# Patient Record
Sex: Male | Born: 1955 | Race: Black or African American | Hispanic: No | Marital: Married | Smoking: Never smoker
Health system: Southern US, Community
[De-identification: ages and names within clinical notes are randomized; demographics above are authoritative.]

## PROBLEM LIST (undated history)

## (undated) DIAGNOSIS — E119 Type 2 diabetes mellitus without complications: Secondary | ICD-10-CM

## (undated) DIAGNOSIS — I10 Essential (primary) hypertension: Secondary | ICD-10-CM

## (undated) DIAGNOSIS — E785 Hyperlipidemia, unspecified: Secondary | ICD-10-CM

## (undated) HISTORY — DX: Hyperlipidemia, unspecified: E78.5

## (undated) HISTORY — DX: Essential (primary) hypertension: I10

## (undated) HISTORY — PX: HEMORROIDECTOMY: SUR656

## (undated) HISTORY — DX: Type 2 diabetes mellitus without complications: E11.9

---

## 2013-02-20 ENCOUNTER — Other Ambulatory Visit: Payer: Self-pay | Admitting: Neurology

## 2013-02-20 DIAGNOSIS — R51 Headache: Principal | ICD-10-CM

## 2013-02-20 DIAGNOSIS — R519 Headache, unspecified: Secondary | ICD-10-CM

## 2013-03-01 ENCOUNTER — Ambulatory Visit (HOSPITAL_COMMUNITY)
Admission: RE | Admit: 2013-03-01 | Discharge: 2013-03-01 | Disposition: A | Discharge status: Home / Self Care | Payer: Managed Care, Other (non HMO) | Source: Ambulatory Visit | Attending: Neurology | Admitting: Neurology

## 2013-03-01 DIAGNOSIS — R51 Headache: Secondary | ICD-10-CM | POA: Insufficient documentation

## 2013-03-01 DIAGNOSIS — J3489 Other specified disorders of nose and nasal sinuses: Secondary | ICD-10-CM | POA: Insufficient documentation

## 2013-03-01 DIAGNOSIS — R519 Headache, unspecified: Secondary | ICD-10-CM

## 2013-03-01 DIAGNOSIS — I10 Essential (primary) hypertension: Secondary | ICD-10-CM | POA: Insufficient documentation

## 2015-03-12 LAB — HEMOGLOBIN A1C: HEMOGLOBIN A1C: 8.4

## 2015-06-01 ENCOUNTER — Ambulatory Visit (INDEPENDENT_AMBULATORY_CARE_PROVIDER_SITE_OTHER): Payer: Self-pay | Admitting: "Endocrinology

## 2015-06-01 ENCOUNTER — Encounter: Payer: Self-pay | Admitting: "Endocrinology

## 2015-06-01 VITALS — BP 135/84 | HR 72 | Ht 70.0 in | Wt 200.0 lb

## 2015-06-01 DIAGNOSIS — E782 Mixed hyperlipidemia: Secondary | ICD-10-CM | POA: Insufficient documentation

## 2015-06-01 DIAGNOSIS — E785 Hyperlipidemia, unspecified: Secondary | ICD-10-CM

## 2015-06-01 DIAGNOSIS — I10 Essential (primary) hypertension: Secondary | ICD-10-CM | POA: Insufficient documentation

## 2015-06-01 DIAGNOSIS — E118 Type 2 diabetes mellitus with unspecified complications: Secondary | ICD-10-CM

## 2015-06-01 DIAGNOSIS — E119 Type 2 diabetes mellitus without complications: Secondary | ICD-10-CM | POA: Insufficient documentation

## 2015-06-01 DIAGNOSIS — E1165 Type 2 diabetes mellitus with hyperglycemia: Secondary | ICD-10-CM

## 2015-06-01 DIAGNOSIS — IMO0002 Reserved for concepts with insufficient information to code with codable children: Secondary | ICD-10-CM

## 2015-06-01 MED ORDER — EMPAGLIFLOZIN 10 MG PO TABS: 10.0000 mg | ORAL_TABLET | Freq: Every day | ORAL | Status: DC

## 2015-06-01 NOTE — Patient Instructions (Signed)

## 2015-06-01 NOTE — Progress Notes (Signed)
Subjective:    Patient ID: Darryl Mayer, male    DOB: 1955/02/16. Patient is being seen in consultation for management of diabetes requested by  TAPPER,DAVID B, MD  Past Medical History  Diagnosis Date  . Diabetes mellitus, type II ([place])   . Hypertension   . Hyperlipidemia    Past Surgical History  Procedure Laterality Date  . Hemorroidectomy     Social History   Social History  . Marital Status: Married    Spouse Name: N/A  . Number of Children: N/A  . Years of Education: N/A   Social History Main Topics  . Smoking status: Never Smoker   . Smokeless tobacco: None  . Alcohol Use: None  . Drug Use: No  . Sexual Activity: Not Asked   Other Topics Concern  . None   Social History Narrative  . None   Outpatient Encounter Prescriptions as of 06/01/2015  Medication Sig  . aspirin 81 MG tablet Take 81 mg by mouth daily.  Marland Kitchen atorvastatin (LIPITOR) 10 MG tablet Take 10 mg by mouth daily.  Marland Kitchen lisinopril-hydrochlorothiazide (PRINZIDE,ZESTORETIC) 20-12.5 MG tablet Take 2 tablets by mouth daily.  . metFORMIN (GLUCOPHAGE) 1000 MG tablet Take 1,000 mg by mouth 2 (two) times daily with a meal.  . NIFEdipine (PROCARDIA XL/ADALAT-CC) 90 MG 24 hr tablet Take 90 mg by mouth daily.  . potassium chloride (K-DUR) 10 MEQ tablet Take 10 mEq by mouth daily.  . tamsulosin (FLOMAX) 0.4 MG CAPS capsule Take 0.8 mg by mouth at bedtime.  . [DISCONTINUED] glimepiride (AMARYL) 4 MG tablet Take 4 mg by mouth daily with breakfast.  . empagliflozin (JARDIANCE) 10 MG TABS tablet Take 10 mg by mouth daily.   No facility-administered encounter medications on file as of 06/01/2015.   ALLERGIES: No Known Allergies VACCINATION STATUS:  There is no immunization history on file for this patient.  Diabetes He presents for his initial diabetic visit. He has type 2 diabetes mellitus. Onset time: He was diagnosed at approximate age of 5 years. His disease course has been worsening. There are no  hypoglycemic associated symptoms. Pertinent negatives for hypoglycemia include no confusion, headaches, pallor or seizures. Associated symptoms include polydipsia and polyuria. Pertinent negatives for diabetes include no chest pain, no fatigue, no polyphagia and no weakness. There are no hypoglycemic complications. Symptoms are worsening. There are no diabetic complications. Risk factors for coronary artery disease include diabetes mellitus, dyslipidemia, male sex, hypertension and sedentary lifestyle. Current diabetic treatment includes oral agent (dual therapy). His weight is stable. He is following a generally unhealthy diet. When asked about meal planning, he reported none. He has not had a previous visit with a dietitian. He never participates in exercise. Home blood sugar record trend: He came with a log showing fasting blood glucose profile for the last week ranging between 101 -160. His breakfast blood glucose range is generally 130-140 mg/dl. An ACE inhibitor/angiotensin II receptor blocker is being taken. Eye exam is current.  Hyperlipidemia This is a chronic problem. The current episode started more than 1 year ago. Pertinent negatives include no chest pain, myalgias or shortness of breath. Current antihyperlipidemic treatment includes statins. Risk factors for coronary artery disease include diabetes mellitus, dyslipidemia, hypertension, male sex and a sedentary lifestyle.  Hypertension This is a chronic problem. The current episode started more than 1 year ago. Pertinent negatives include no chest pain, headaches, neck pain, palpitations or shortness of breath. Risk factors for coronary artery disease include dyslipidemia, diabetes mellitus, male  gender and sedentary lifestyle. Past treatments include ACE inhibitors.   Review of Systems  Constitutional: Negative for fever, chills, fatigue and unexpected weight change.  HENT: Negative for dental problem, mouth sores and trouble swallowing.    Eyes: Negative for visual disturbance.  Respiratory: Negative for cough, choking, chest tightness, shortness of breath and wheezing.   Cardiovascular: Negative for chest pain, palpitations and leg swelling.  Gastrointestinal: Negative for nausea, vomiting, abdominal pain, diarrhea, constipation and abdominal distention.  Endocrine: Positive for polydipsia and polyuria. Negative for polyphagia.  Genitourinary: Negative for dysuria, urgency, hematuria and flank pain.  Musculoskeletal: Negative for myalgias, back pain, gait problem and neck pain.  Skin: Negative for pallor, rash and wound.  Neurological: Negative for seizures, syncope, weakness, numbness and headaches.  Psychiatric/Behavioral: Negative.  Negative for confusion and dysphoric mood.    Objective:    BP 135/84 mmHg  Pulse 72  Ht 5\' 10"  (1.778 m)  Wt 200 lb (90.719 kg)  BMI 28.70 kg/m2  SpO2 97%  Wt Readings from Last 3 Encounters:  06/01/15 200 lb (90.719 kg)    Physical Exam  Constitutional: He is oriented to person, place, and time. He appears well-developed and well-nourished. He is cooperative. No distress.  HENT:  Head: Normocephalic and atraumatic.  Eyes: EOM are normal.  Neck: Normal range of motion. Neck supple. No tracheal deviation present. No thyromegaly present.  Cardiovascular: Normal rate, S1 normal, S2 normal and normal heart sounds.  Exam reveals no gallop.   No murmur heard. Pulses:      Dorsalis pedis pulses are 1+ on the right side, and 1+ on the left side.       Posterior tibial pulses are 1+ on the right side, and 1+ on the left side.  Pulmonary/Chest: Breath sounds normal. No respiratory distress. He has no wheezes.  Abdominal: Soft. Bowel sounds are normal. He exhibits no distension. There is no tenderness. There is no guarding and no CVA tenderness.  Musculoskeletal: He exhibits no edema.       Right shoulder: He exhibits no swelling and no deformity.  Neurological: He is alert and oriented to  person, place, and time. He has normal strength and normal reflexes. No cranial nerve deficit or sensory deficit. Gait normal.  Skin: Skin is warm and dry. No rash noted. No cyanosis. Nails show no clubbing.  Psychiatric: He has a normal mood and affect. His speech is normal and behavior is normal. Judgment and thought content normal. Cognition and memory are normal.        Assessment & Plan:   1. Uncontrolled type 2 diabetes mellitus with complication, without long-term current use of insulin ([place])  - Patient has currently uncontrolled symptomatic type 2 DM since  60 years of age,  with most recent A1c of 8.4 %. Recent labs reviewed.  - No reported gross complications, however patient remains at a high risk for more acute and chronic complications of diabetes which include CAD, CVA, CKD, retinopathy, and neuropathy. These are all discussed in detail with the patient.  - I have counseled the patient on diet management and weight loss, by adopting a carbohydrate restricted/protein rich diet.  - Suggestion is made for patient to avoid simple carbohydrates   from their diet including Cakes , Desserts, Ice Cream,  Soda (  diet and regular) , Sweet Tea , Candies,  Chips, Cookies, Artificial Sweeteners,   and "Sugar-free" Products . This will help patient to have stable blood glucose profile and potentially avoid  unintended weight gain.  - I encouraged the patient to switch to  unprocessed or minimally processed complex starch and increased protein intake (animal or plant source), fruits, and vegetables.  - Patient is advised to stick to a routine mealtimes to eat 3 meals  a day and avoid unnecessary snacks ( to snack only to correct hypoglycemia).  - The patient will be scheduled with Jearld Fenton, RDN, CDE for individualized DM education.  - I have approached patient with the following individualized plan to manage diabetes and patient agrees:   - I  will proceed to initiate strict  monitoring of glucose  AC and HS. - If his glycemic profile is significantly above target he will be approached for a brief period off basal insulin therapy.  -Patient is encouraged to call clinic for blood glucose levels less than 70 or above 300 mg /dl. - I will continue metformin 1000 mg by mouth twice a day, therapeutically suitable for patient. - I will discontinue glimepiride, risk outweighs benefit for this patient. -I will add Jardiance 10 mg by mouth every a.m. Side effects and precautions discussed with him.  - Patient will be considered for incretin therapy as appropriate next visit. - Patient specific target  A1c;  LDL, HDL, Triglycerides, and  Waist Circumference were discussed in detail.  2) BP/HTN: Controlled. Continue current medications including ACEI/ARB. 3) Lipids/HPL:  Controlled, LDL 75.  continue statins. 4)  Weight/Diet: CDE Consult will be initiated , exercise, and detailed carbohydrates information provided.  5) Chronic Care/Health Maintenance:  -Patient is on ACEI/ARB and Statin medications and encouraged to continue to follow up with Ophthalmology, Podiatrist at least yearly or according to recommendations, and advised to   stay away from smoking. I have recommended yearly flu vaccine and pneumonia vaccination at least every 5 years; moderate intensity exercise for up to 150 minutes weekly; and  sleep for at least 7 hours a day.  - 60 minutes of time was spent on the care of this patient , 50% of which was applied for counseling on diabetes complications and their preventions.  - Patient to bring meter and  blood glucose logs during their next visit.   - I advised patient to maintain close follow up with TAPPER,DAVID B, MD for primary care needs.  Follow up plan: - Return in about 1 week (around 06/08/2015) for diabetes, high blood pressure, high cholesterol, follow up with meter and logs- no labs.  Glade Lloyd, MD Phone: [phone]  Fax: [phone]    06/01/2015, 4:35 PM

## 2015-06-02 ENCOUNTER — Other Ambulatory Visit: Payer: Self-pay

## 2015-06-02 MED ORDER — GLUCOSE BLOOD VI STRP: ORAL_STRIP | Status: DC

## 2015-06-18 ENCOUNTER — Encounter: Payer: Self-pay | Admitting: "Endocrinology

## 2015-06-22 ENCOUNTER — Encounter: Payer: Self-pay | Admitting: "Endocrinology

## 2015-06-22 ENCOUNTER — Ambulatory Visit (INDEPENDENT_AMBULATORY_CARE_PROVIDER_SITE_OTHER): Payer: Managed Care, Other (non HMO) | Admitting: "Endocrinology

## 2015-06-22 VITALS — BP 110/69 | HR 64 | Ht 70.0 in | Wt 195.0 lb

## 2015-06-22 DIAGNOSIS — IMO0002 Reserved for concepts with insufficient information to code with codable children: Secondary | ICD-10-CM

## 2015-06-22 DIAGNOSIS — E118 Type 2 diabetes mellitus with unspecified complications: Secondary | ICD-10-CM

## 2015-06-22 DIAGNOSIS — E785 Hyperlipidemia, unspecified: Secondary | ICD-10-CM | POA: Diagnosis not present

## 2015-06-22 DIAGNOSIS — E1165 Type 2 diabetes mellitus with hyperglycemia: Secondary | ICD-10-CM | POA: Diagnosis not present

## 2015-06-22 DIAGNOSIS — I10 Essential (primary) hypertension: Secondary | ICD-10-CM | POA: Diagnosis not present

## 2015-06-22 NOTE — Progress Notes (Signed)
Subjective:    Patient ID: Darryl Mayer, male    DOB: [birth year]. Patient is being seen in Follow-up for management of diabetes requested by  TAPPER,DAVID B, MD  Past Medical History  Diagnosis Date  . Diabetes mellitus, type II ([place])   . Hypertension   . Hyperlipidemia    Past Surgical History  Procedure Laterality Date  . Hemorroidectomy     Social History   Social History  . Marital Status: Married    Spouse Name: N/A  . Number of Children: N/A  . Years of Education: N/A   Social History Main Topics  . Smoking status: Never Smoker   . Smokeless tobacco: None  . Alcohol Use: None  . Drug Use: No  . Sexual Activity: Not Asked   Other Topics Concern  . None   Social History Narrative   Outpatient Encounter Prescriptions as of 06/22/2015  Medication Sig  . aspirin 81 MG tablet Take 81 mg by mouth daily.  Marland Kitchen atorvastatin (LIPITOR) 10 MG tablet Take 10 mg by mouth daily.  . empagliflozin (JARDIANCE) 10 MG TABS tablet Take 10 mg by mouth daily.  Marland Kitchen glucose blood (ONETOUCH VERIO) test strip Use as instructed 4 x daily. E11.65  . lisinopril-hydrochlorothiazide (PRINZIDE,ZESTORETIC) 20-12.5 MG tablet Take 2 tablets by mouth daily.  . metFORMIN (GLUCOPHAGE) 1000 MG tablet Take 1,000 mg by mouth 2 (two) times daily with a meal.  . NIFEdipine (PROCARDIA XL/ADALAT-CC) 90 MG 24 hr tablet Take 90 mg by mouth daily.  . potassium chloride (K-DUR) 10 MEQ tablet Take 10 mEq by mouth daily.  . tamsulosin (FLOMAX) 0.4 MG CAPS capsule Take 0.8 mg by mouth at bedtime.   No facility-administered encounter medications on file as of 06/22/2015.   ALLERGIES: No Known Allergies VACCINATION STATUS:  There is no immunization history on file for this patient.  Diabetes He presents for his follow-up diabetic visit. He has type 2 diabetes mellitus. Onset time: He was diagnosed at approximate age of 3 years. His disease course has been improving. There are no hypoglycemic associated  symptoms. Pertinent negatives for hypoglycemia include no confusion, headaches, pallor or seizures. Associated symptoms include polydipsia and polyuria. Pertinent negatives for diabetes include no chest pain, no fatigue, no polyphagia and no weakness. There are no hypoglycemic complications. Symptoms are improving. There are no diabetic complications. Risk factors for coronary artery disease include diabetes mellitus, dyslipidemia, male sex, hypertension and sedentary lifestyle. Current diabetic treatment includes oral agent (dual therapy). His weight is decreasing steadily. He is following a generally unhealthy diet. When asked about meal planning, he reported none. He has not had a previous visit with a dietitian. He never participates in exercise. His breakfast blood glucose range is generally 110-130 mg/dl. His lunch blood glucose range is generally 130-140 mg/dl. His dinner blood glucose range is generally 130-140 mg/dl. His overall blood glucose range is 130-140 mg/dl. An ACE inhibitor/angiotensin II receptor blocker is being taken. Eye exam is current.  Hyperlipidemia This is a chronic problem. The current episode started more than 1 year ago. Pertinent negatives include no chest pain, myalgias or shortness of breath. Current antihyperlipidemic treatment includes statins. Risk factors for coronary artery disease include diabetes mellitus, dyslipidemia, hypertension, male sex and a sedentary lifestyle.  Hypertension This is a chronic problem. The current episode started more than 1 year ago. Pertinent negatives include no chest pain, headaches, neck pain, palpitations or shortness of breath. Risk factors for coronary artery disease include dyslipidemia, diabetes mellitus, male  gender and sedentary lifestyle. Past treatments include ACE inhibitors.   Review of Systems  Constitutional: Negative for fever, chills, fatigue and unexpected weight change.  HENT: Negative for dental problem, mouth sores and  trouble swallowing.   Eyes: Negative for visual disturbance.  Respiratory: Negative for cough, choking, chest tightness, shortness of breath and wheezing.   Cardiovascular: Negative for chest pain, palpitations and leg swelling.  Gastrointestinal: Negative for nausea, vomiting, abdominal pain, diarrhea, constipation and abdominal distention.  Endocrine: Positive for polydipsia and polyuria. Negative for polyphagia.  Genitourinary: Negative for dysuria, urgency, hematuria and flank pain.  Musculoskeletal: Negative for myalgias, back pain, gait problem and neck pain.  Skin: Negative for pallor, rash and wound.  Neurological: Negative for seizures, syncope, weakness, numbness and headaches.  Psychiatric/Behavioral: Negative.  Negative for confusion and dysphoric mood.    Objective:    BP 110/69 mmHg  Pulse 64  Ht 5\' 10"  (1.778 m)  Wt 195 lb (88.451 kg)  BMI 27.98 kg/m2  SpO2 96%  Wt Readings from Last 3 Encounters:  06/22/15 195 lb (88.451 kg)  06/01/15 200 lb (90.719 kg)    Physical Exam  Constitutional: He is oriented to person, place, and time. He appears well-developed and well-nourished. He is cooperative. No distress.  HENT:  Head: Normocephalic and atraumatic.  Eyes: EOM are normal.  Neck: Normal range of motion. Neck supple. No tracheal deviation present. No thyromegaly present.  Cardiovascular: Normal rate, S1 normal, S2 normal and normal heart sounds.  Exam reveals no gallop.   No murmur heard. Pulses:      Dorsalis pedis pulses are 1+ on the right side, and 1+ on the left side.       Posterior tibial pulses are 1+ on the right side, and 1+ on the left side.  Pulmonary/Chest: Breath sounds normal. No respiratory distress. He has no wheezes.  Abdominal: Soft. Bowel sounds are normal. He exhibits no distension. There is no tenderness. There is no guarding and no CVA tenderness.  Musculoskeletal: He exhibits no edema.       Right shoulder: He exhibits no swelling and no  deformity.  Neurological: He is alert and oriented to person, place, and time. He has normal strength and normal reflexes. No cranial nerve deficit or sensory deficit. Gait normal.  Skin: Skin is warm and dry. No rash noted. No cyanosis. Nails show no clubbing.  Psychiatric: He has a normal mood and affect. His speech is normal and behavior is normal. Judgment and thought content normal. Cognition and memory are normal.     Assessment & Plan:   1. Uncontrolled type 2 diabetes mellitus with complication, without long-term current use of insulin ([place])  - Patient has currently uncontrolled symptomatic type 2 DM since  60 years of age,  with most recent A1c of 8.4 %. Recent labs reviewed.  - No reported gross complications, however patient remains at a high risk for more acute and chronic complications of diabetes which include CAD, CVA, CKD, retinopathy, and neuropathy. These are all discussed in detail with the patient.  - I have counseled the patient on diet management and weight loss, by adopting a carbohydrate restricted/protein rich diet.  - Suggestion is made for patient to avoid simple carbohydrates   from their diet including Cakes , Desserts, Ice Cream,  Soda (  diet and regular) , Sweet Tea , Candies,  Chips, Cookies, Artificial Sweeteners,   and "Sugar-free" Products . This will help patient to have stable blood glucose profile  and potentially avoid unintended weight gain.  - I encouraged the patient to switch to  unprocessed or minimally processed complex starch and increased protein intake (animal or plant source), fruits, and vegetables.  - Patient is advised to stick to a routine mealtimes to eat 3 meals  a day and avoid unnecessary snacks ( to snack only to correct hypoglycemia).  - The patient will be scheduled with Jearld Fenton, RDN, CDE for individualized DM education.  - I have approached patient with the following individualized plan to manage diabetes and patient agrees:    - I will continue metformin 1000 mg by mouth twice a day, therapeutically suitable for patient.  -I will continue Jardiance 10 mg by mouth every a.m. Side effects and precautions discussed with him. - He will not need insulin therapy for now. - Patient will be considered for incretin therapy as appropriate next visit. - Patient specific target  A1c;  LDL, HDL, Triglycerides, and  Waist Circumference were discussed in detail.  2) BP/HTN: Controlled. Continue current medications including ACEI/ARB. 3) Lipids/HPL:  Controlled, LDL 75.  continue statins. 4)  Weight/Diet: CDE Consult will be initiated , exercise, and detailed carbohydrates information provided.  5) Chronic Care/Health Maintenance:  -Patient is on ACEI/ARB and Statin medications and encouraged to continue to follow up with Ophthalmology, Podiatrist at least yearly or according to recommendations, and advised to   stay away from smoking. I have recommended yearly flu vaccine and pneumonia vaccination at least every 5 years; moderate intensity exercise for up to 150 minutes weekly; and  sleep for at least 7 hours a day.  - 60 minutes of time was spent on the care of this patient , 50% of which was applied for counseling on diabetes complications and their preventions.  - Patient to bring meter and  blood glucose logs during their next visit.   - I advised patient to maintain close follow up with TAPPER,DAVID B, MD for primary care needs.  Follow up plan: - Return in about 8 weeks (around 08/17/2015) for follow up with pre-visit labs.  Glade Lloyd, MD Phone: [phone]  Fax: [phone]   06/22/2015, 5:02 PM

## 2015-06-22 NOTE — Patient Instructions (Signed)

## 2015-07-09 ENCOUNTER — Other Ambulatory Visit: Payer: Self-pay

## 2015-07-09 MED ORDER — EMPAGLIFLOZIN 10 MG PO TABS: 10.0000 mg | ORAL_TABLET | Freq: Every day | ORAL | Status: DC

## 2015-07-13 ENCOUNTER — Other Ambulatory Visit: Payer: Self-pay

## 2015-07-13 MED ORDER — EMPAGLIFLOZIN 10 MG PO TABS: 10.0000 mg | ORAL_TABLET | Freq: Every day | ORAL | Status: DC

## 2015-08-07 LAB — LIPID PANEL
Cholesterol: 128 mg/dL (ref 0–200)
HDL: 47 mg/dL (ref 35–70)
LDL Cholesterol: 67 mg/dL
TRIGLYCERIDES: 68 mg/dL (ref 40–160)

## 2015-08-07 LAB — HEMOGLOBIN A1C: HEMOGLOBIN A1C: 7.4

## 2015-08-07 LAB — TSH: TSH: 2.3 u[IU]/mL (ref <=5.90)

## 2015-08-17 ENCOUNTER — Ambulatory Visit (INDEPENDENT_AMBULATORY_CARE_PROVIDER_SITE_OTHER): Payer: Managed Care, Other (non HMO) | Admitting: "Endocrinology

## 2015-08-17 ENCOUNTER — Encounter: Payer: Self-pay | Admitting: "Endocrinology

## 2015-08-17 VITALS — BP 121/75 | HR 73 | Ht 70.0 in | Wt 190.0 lb

## 2015-08-17 DIAGNOSIS — E118 Type 2 diabetes mellitus with unspecified complications: Secondary | ICD-10-CM

## 2015-08-17 DIAGNOSIS — E1165 Type 2 diabetes mellitus with hyperglycemia: Secondary | ICD-10-CM | POA: Diagnosis not present

## 2015-08-17 DIAGNOSIS — I10 Essential (primary) hypertension: Secondary | ICD-10-CM | POA: Diagnosis not present

## 2015-08-17 DIAGNOSIS — IMO0002 Reserved for concepts with insufficient information to code with codable children: Secondary | ICD-10-CM

## 2015-08-17 DIAGNOSIS — E785 Hyperlipidemia, unspecified: Secondary | ICD-10-CM

## 2015-08-17 NOTE — Progress Notes (Signed)
Subjective:    Patient ID: Darryl Mayer, male    DOB: [birth year]. Patient is being seen in Follow-up for management of diabetes requested by  TAPPER,DAVID B, MD  Past Medical History  Diagnosis Date  . Diabetes mellitus, type II ([place])   . Hypertension   . Hyperlipidemia    Past Surgical History  Procedure Laterality Date  . Hemorroidectomy     Social History   Social History  . Marital Status: Married    Spouse Name: N/A  . Number of Children: N/A  . Years of Education: N/A   Social History Main Topics  . Smoking status: Never Smoker   . Smokeless tobacco: None  . Alcohol Use: None  . Drug Use: No  . Sexual Activity: Not Asked   Other Topics Concern  . None   Social History Narrative   Outpatient Encounter Prescriptions as of 08/17/2015  Medication Sig  . aspirin 81 MG tablet Take 81 mg by mouth daily.  Marland Kitchen atorvastatin (LIPITOR) 10 MG tablet Take 10 mg by mouth daily.  . empagliflozin (JARDIANCE) 10 MG TABS tablet Take 10 mg by mouth daily.  Marland Kitchen glucose blood (ONETOUCH VERIO) test strip Use as instructed 4 x daily. E11.65  . lisinopril-hydrochlorothiazide (PRINZIDE,ZESTORETIC) 20-12.5 MG tablet Take 2 tablets by mouth daily.  . metFORMIN (GLUCOPHAGE) 1000 MG tablet Take 1,000 mg by mouth 2 (two) times daily with a meal.  . NIFEdipine (PROCARDIA XL/ADALAT-CC) 90 MG 24 hr tablet Take 90 mg by mouth daily.  . potassium chloride (K-DUR) 10 MEQ tablet Take 10 mEq by mouth daily.  . tamsulosin (FLOMAX) 0.4 MG CAPS capsule Take 0.8 mg by mouth at bedtime.   No facility-administered encounter medications on file as of 08/17/2015.   ALLERGIES: No Known Allergies VACCINATION STATUS:  There is no immunization history on file for this patient.  Diabetes He presents for his follow-up diabetic visit. He has type 2 diabetes mellitus. Onset time: He was diagnosed at approximate age of 60 years. His disease course has been improving. There are no hypoglycemic associated  symptoms. Pertinent negatives for hypoglycemia include no confusion, headaches, pallor or seizures. Associated symptoms include polydipsia and polyuria. Pertinent negatives for diabetes include no chest pain, no fatigue, no polyphagia and no weakness. There are no hypoglycemic complications. Symptoms are improving. There are no diabetic complications. Risk factors for coronary artery disease include diabetes mellitus, dyslipidemia, male sex, hypertension and sedentary lifestyle. Current diabetic treatment includes oral agent (dual therapy). His weight is decreasing steadily. He is following a generally unhealthy diet. When asked about meal planning, he reported none. He has not had a previous visit with a dietitian. He never participates in exercise. An ACE inhibitor/angiotensin II receptor blocker is being taken. Eye exam is current.  Hyperlipidemia This is a chronic problem. The current episode started more than 1 year ago. Pertinent negatives include no chest pain, myalgias or shortness of breath. Current antihyperlipidemic treatment includes statins. Risk factors for coronary artery disease include diabetes mellitus, dyslipidemia, hypertension, male sex and a sedentary lifestyle.  Hypertension This is a chronic problem. The current episode started more than 1 year ago. Pertinent negatives include no chest pain, headaches, neck pain, palpitations or shortness of breath. Risk factors for coronary artery disease include dyslipidemia, diabetes mellitus, male gender and sedentary lifestyle. Past treatments include ACE inhibitors.   Review of Systems  Constitutional: Negative for fever, chills, fatigue and unexpected weight change.  HENT: Negative for dental problem, mouth sores and trouble  swallowing.   Eyes: Negative for visual disturbance.  Respiratory: Negative for cough, choking, chest tightness, shortness of breath and wheezing.   Cardiovascular: Negative for chest pain, palpitations and leg swelling.   Gastrointestinal: Negative for nausea, vomiting, abdominal pain, diarrhea, constipation and abdominal distention.  Endocrine: Positive for polydipsia and polyuria. Negative for polyphagia.  Genitourinary: Negative for dysuria, urgency, hematuria and flank pain.  Musculoskeletal: Negative for myalgias, back pain, gait problem and neck pain.  Skin: Negative for pallor, rash and wound.  Neurological: Negative for seizures, syncope, weakness, numbness and headaches.  Psychiatric/Behavioral: Negative.  Negative for confusion and dysphoric mood.    Objective:    BP 121/75 mmHg  Pulse 73  Ht 5\' 10"  (1.778 m)  Wt 190 lb (86.183 kg)  BMI 27.26 kg/m2  Wt Readings from Last 3 Encounters:  08/17/15 190 lb (86.183 kg)  06/22/15 195 lb (88.451 kg)  06/01/15 200 lb (90.719 kg)    Physical Exam  Constitutional: He is oriented to person, place, and time. He appears well-developed and well-nourished. He is cooperative. No distress.  HENT:  Head: Normocephalic and atraumatic.  Eyes: EOM are normal.  Neck: Normal range of motion. Neck supple. No tracheal deviation present. No thyromegaly present.  Cardiovascular: Normal rate, S1 normal, S2 normal and normal heart sounds.  Exam reveals no gallop.   No murmur heard. Pulses:      Dorsalis pedis pulses are 1+ on the right side, and 1+ on the left side.       Posterior tibial pulses are 1+ on the right side, and 1+ on the left side.  Pulmonary/Chest: Breath sounds normal. No respiratory distress. He has no wheezes.  Abdominal: Soft. Bowel sounds are normal. He exhibits no distension. There is no tenderness. There is no guarding and no CVA tenderness.  Musculoskeletal: He exhibits no edema.       Right shoulder: He exhibits no swelling and no deformity.  Neurological: He is alert and oriented to person, place, and time. He has normal strength and normal reflexes. No cranial nerve deficit or sensory deficit. Gait normal.  Skin: Skin is warm and dry. No  rash noted. No cyanosis. Nails show no clubbing.  Psychiatric: He has a normal mood and affect. His speech is normal and behavior is normal. Judgment and thought content normal. Cognition and memory are normal.     Assessment & Plan:   1. Uncontrolled type 2 diabetes mellitus with complication, without long-term current use of insulin ([place])  - Patient has currently uncontrolled symptomatic type 2 DM since  60 years of age,  with most recent A1c of 7.4% improving from 8.4 %. Recent labs reviewed.  - No reported gross complications, however patient remains at a high risk for more acute and chronic complications of diabetes which include CAD, CVA, CKD, retinopathy, and neuropathy. These are all discussed in detail with the patient.  - I have counseled the patient on diet management and weight loss, by adopting a carbohydrate restricted/protein rich diet.  - Suggestion is made for patient to avoid simple carbohydrates   from their diet including Cakes , Desserts, Ice Cream,  Soda (  diet and regular) , Sweet Tea , Candies,  Chips, Cookies, Artificial Sweeteners,   and "Sugar-free" Products . This will help patient to have stable blood glucose profile and potentially avoid unintended weight gain.  - I encouraged the patient to switch to  unprocessed or minimally processed complex starch and increased protein intake (animal or plant  source), fruits, and vegetables.  - Patient is advised to stick to a routine mealtimes to eat 3 meals  a day and avoid unnecessary snacks ( to snack only to correct hypoglycemia).  - The patient will be scheduled with Jearld Fenton, RDN, CDE for individualized DM education.  - I have approached patient with the following individualized plan to manage diabetes and patient agrees:   - Based on his progress, he will not need insulin treatment for now. I will continue metformin 1000 mg by mouth twice a day, therapeutically suitable for patient.  -I will continue  Jardiance 10 mg by mouth every a.m. Side effects and precautions discussed with him.  - Patient will be considered for incretin therapy as appropriate next visit. - Patient specific target  A1c;  LDL, HDL, Triglycerides, and  Waist Circumference were discussed in detail.  2) BP/HTN: Controlled. Continue current medications including ACEI/ARB. 3) Lipids/HPL:  Controlled, LDL 75.  continue statins. 4)  Weight/Diet: CDE Consult will be initiated , exercise, and detailed carbohydrates information provided.  5) Chronic Care/Health Maintenance:  -Patient is on ACEI/ARB and Statin medications and encouraged to continue to follow up with Ophthalmology, Podiatrist at least yearly or according to recommendations, and advised to   stay away from smoking. I have recommended yearly flu vaccine and pneumonia vaccination at least every 5 years; moderate intensity exercise for up to 150 minutes weekly; and  sleep for at least 7 hours a day.  - 25 minutes of time was spent on the care of this patient , 50% of which was applied for counseling on diabetes complications and their preventions.  - Patient to bring meter and  blood glucose logs during their next visit.   - I advised patient to maintain close follow up with TAPPER,DAVID B, MD for primary care needs.  Follow up plan: - Return in about 3 months (around 11/17/2015) for follow up with pre-visit labs.  Glade Lloyd, MD Phone: [phone]  Fax: [phone]   08/17/2015, 3:59 PM

## 2015-08-17 NOTE — Patient Instructions (Signed)

## 2015-09-19 ENCOUNTER — Other Ambulatory Visit: Payer: Self-pay | Admitting: "Endocrinology

## 2015-11-11 ENCOUNTER — Other Ambulatory Visit: Payer: Self-pay | Admitting: "Endocrinology

## 2015-11-12 LAB — HEMOGLOBIN A1C: Hemoglobin A1C: 7.2

## 2015-11-19 ENCOUNTER — Encounter: Payer: Self-pay | Admitting: "Endocrinology

## 2015-11-19 ENCOUNTER — Ambulatory Visit (INDEPENDENT_AMBULATORY_CARE_PROVIDER_SITE_OTHER): Payer: Managed Care, Other (non HMO) | Admitting: "Endocrinology

## 2015-11-19 VITALS — BP 111/70 | HR 69 | Ht 70.0 in | Wt 183.0 lb

## 2015-11-19 DIAGNOSIS — I10 Essential (primary) hypertension: Secondary | ICD-10-CM

## 2015-11-19 DIAGNOSIS — E1165 Type 2 diabetes mellitus with hyperglycemia: Secondary | ICD-10-CM | POA: Diagnosis not present

## 2015-11-19 DIAGNOSIS — E118 Type 2 diabetes mellitus with unspecified complications: Secondary | ICD-10-CM | POA: Diagnosis not present

## 2015-11-19 DIAGNOSIS — E782 Mixed hyperlipidemia: Secondary | ICD-10-CM | POA: Diagnosis not present

## 2015-11-19 DIAGNOSIS — IMO0002 Reserved for concepts with insufficient information to code with codable children: Secondary | ICD-10-CM

## 2015-11-19 NOTE — Progress Notes (Signed)
Subjective:    Patient ID: Darryl Mayer, male    DOB: 1955/03/14. Patient is being seen in Follow-up for management of diabetes requested by  TAPPER,DAVID B, MD  Past Medical History:  Diagnosis Date  . Diabetes mellitus, type II ([place])   . Hyperlipidemia   . Hypertension    Past Surgical History:  Procedure Laterality Date  . HEMORROIDECTOMY     Social History   Social History  . Marital status: Married    Spouse name: N/A  . Number of children: N/A  . Years of education: N/A   Social History Main Topics  . Smoking status: Never Smoker  . Smokeless tobacco: Never Used  . Alcohol use None  . Drug use: No  . Sexual activity: Not Asked   Other Topics Concern  . None   Social History Narrative  . None   Outpatient Encounter Prescriptions as of 11/19/2015  Medication Sig  . aspirin 81 MG tablet Take 81 mg by mouth daily.  Marland Kitchen atorvastatin (LIPITOR) 10 MG tablet Take 10 mg by mouth daily.  Marland Kitchen JARDIANCE 10 MG TABS tablet TAKE 1 TABLET DAILY  . lisinopril-hydrochlorothiazide (PRINZIDE,ZESTORETIC) 20-12.5 MG tablet Take 2 tablets by mouth daily.  . metFORMIN (GLUCOPHAGE) 1000 MG tablet Take 1,000 mg by mouth 2 (two) times daily with a meal.  . NIFEdipine (PROCARDIA XL/ADALAT-CC) 90 MG 24 hr tablet Take 90 mg by mouth daily.  Glory Rosebush VERIO test strip USE AS DIRECTED FOUR TIMES A DAY  . potassium chloride (K-DUR) 10 MEQ tablet Take 10 mEq by mouth daily.  . tamsulosin (FLOMAX) 0.4 MG CAPS capsule Take 0.8 mg by mouth at bedtime.   No facility-administered encounter medications on file as of 11/19/2015.    ALLERGIES: No Known Allergies VACCINATION STATUS:  There is no immunization history on file for this patient.  Diabetes  He presents for his follow-up diabetic visit. He has type 2 diabetes mellitus. Onset time: He was diagnosed at approximate age of 20 years. His disease course has been improving. There are no hypoglycemic associated symptoms. Pertinent negatives  for hypoglycemia include no confusion, headaches, pallor or seizures. Associated symptoms include polydipsia and polyuria. Pertinent negatives for diabetes include no chest pain, no fatigue, no polyphagia and no weakness. There are no hypoglycemic complications. Symptoms are improving. There are no diabetic complications. Risk factors for coronary artery disease include diabetes mellitus, dyslipidemia, male sex, hypertension and sedentary lifestyle. Current diabetic treatment includes oral agent (dual therapy). His weight is decreasing steadily. He is following a generally unhealthy diet. When asked about meal planning, he reported none. He has not had a previous visit with a dietitian. He never participates in exercise. An ACE inhibitor/angiotensin II receptor blocker is being taken. Eye exam is current.  Hyperlipidemia  This is a chronic problem. The current episode started more than 1 year ago. Pertinent negatives include no chest pain, myalgias or shortness of breath. Current antihyperlipidemic treatment includes statins. Risk factors for coronary artery disease include diabetes mellitus, dyslipidemia, hypertension, male sex and a sedentary lifestyle.  Hypertension  This is a chronic problem. The current episode started more than 1 year ago. Pertinent negatives include no chest pain, headaches, neck pain, palpitations or shortness of breath. Risk factors for coronary artery disease include dyslipidemia, diabetes mellitus, male gender and sedentary lifestyle. Past treatments include ACE inhibitors.   Review of Systems  Constitutional: Negative for chills, fatigue, fever and unexpected weight change.  HENT: Negative for dental problem, mouth sores  and trouble swallowing.   Eyes: Negative for visual disturbance.  Respiratory: Negative for cough, choking, chest tightness, shortness of breath and wheezing.   Cardiovascular: Negative for chest pain, palpitations and leg swelling.  Gastrointestinal:  Negative for abdominal distention, abdominal pain, constipation, diarrhea, nausea and vomiting.  Endocrine: Positive for polydipsia and polyuria. Negative for polyphagia.  Genitourinary: Negative for dysuria, flank pain, hematuria and urgency.  Musculoskeletal: Negative for back pain, gait problem, myalgias and neck pain.  Skin: Negative for pallor, rash and wound.  Neurological: Negative for seizures, syncope, weakness, numbness and headaches.  Psychiatric/Behavioral: Negative.  Negative for confusion and dysphoric mood.    Objective:    BP 111/70   Pulse 69   Ht 5\' 10"  (1.778 m)   Wt 183 lb (83 kg)   BMI 26.26 kg/m   Wt Readings from Last 3 Encounters:  11/19/15 183 lb (83 kg)  08/17/15 190 lb (86.2 kg)  06/22/15 195 lb (88.5 kg)    Physical Exam  Constitutional: He is oriented to person, place, and time. He appears well-developed and well-nourished. He is cooperative. No distress.  HENT:  Head: Normocephalic and atraumatic.  Eyes: EOM are normal.  Neck: Normal range of motion. Neck supple. No tracheal deviation present. No thyromegaly present.  Cardiovascular: Normal rate, S1 normal, S2 normal and normal heart sounds.  Exam reveals no gallop.   No murmur heard. Pulses:      Dorsalis pedis pulses are 1+ on the right side, and 1+ on the left side.       Posterior tibial pulses are 1+ on the right side, and 1+ on the left side.  Pulmonary/Chest: Breath sounds normal. No respiratory distress. He has no wheezes.  Abdominal: Soft. Bowel sounds are normal. He exhibits no distension. There is no tenderness. There is no guarding and no CVA tenderness.  Musculoskeletal: He exhibits no edema.       Right shoulder: He exhibits no swelling and no deformity.  Neurological: He is alert and oriented to person, place, and time. He has normal strength and normal reflexes. No cranial nerve deficit or sensory deficit. Gait normal.  Skin: Skin is warm and dry. No rash noted. No cyanosis. Nails  show no clubbing.  Psychiatric: He has a normal mood and affect. His speech is normal and behavior is normal. Judgment and thought content normal. Cognition and memory are normal.     Assessment & Plan:   1. Uncontrolled type 2 diabetes mellitus with complication, without long-term current use of insulin ([place])  - Patient has currently uncontrolled symptomatic type 2 DM since  60 years of age,  with most recent A1c of 7.2% improving from 8.4 %. Recent labs reviewed.  - No reported gross complications, however patient remains at a high risk for more acute and chronic complications of diabetes which include CAD, CVA, CKD, retinopathy, and neuropathy. These are all discussed in detail with the patient.  - I have counseled the patient on diet management and weight loss, by adopting a carbohydrate restricted/protein rich diet.  - Suggestion is made for patient to avoid simple carbohydrates   from their diet including Cakes , Desserts, Ice Cream,  Soda (  diet and regular) , Sweet Tea , Candies,  Chips, Cookies, Artificial Sweeteners,   and "Sugar-free" Products . This will help patient to have stable blood glucose profile and potentially avoid unintended weight gain.  - I encouraged the patient to switch to  unprocessed or minimally processed complex starch and  increased protein intake (animal or plant source), fruits, and vegetables.  - Patient is advised to stick to a routine mealtimes to eat 3 meals  a day and avoid unnecessary snacks ( to snack only to correct hypoglycemia).  - The patient will be scheduled with Jearld Fenton, RDN, CDE for individualized DM education.  - I have approached patient with the following individualized plan to manage diabetes and patient agrees:   - Based on his progress, he will not need insulin treatment for now. I will continue metformin 1000 mg by mouth twice a day, therapeutically suitable for patient.  -I will continue Jardiance 10 mg by mouth every a.m.  Side effects and precautions discussed with him.  - Patient will be considered for incretin therapy as appropriate next visit. - Patient specific target  A1c;  LDL, HDL, Triglycerides, and  Waist Circumference were discussed in detail.  2) BP/HTN: Controlled. Continue current medications including ACEI/ARB. 3) Lipids/HPL:  Controlled, LDL 75.  continue statins. 4)  Weight/Diet: CDE Consult will be initiated , exercise, and detailed carbohydrates information provided.  5) Chronic Care/Health Maintenance:  -Patient is on ACEI/ARB and Statin medications and encouraged to continue to follow up with Ophthalmology, Podiatrist at least yearly or according to recommendations, and advised to   stay away from smoking. I have recommended yearly flu vaccine and pneumonia vaccination at least every 5 years; moderate intensity exercise for up to 150 minutes weekly; and  sleep for at least 7 hours a day.  - 25 minutes of time was spent on the care of this patient , 50% of which was applied for counseling on diabetes complications and their preventions.  - Patient to bring meter and  blood glucose logs during their next visit.   - I advised patient to maintain close follow up with TAPPER,DAVID B, MD for primary care needs.  Follow up plan: - Return in about 4 months (around 03/21/2016) for follow up with pre-visit labs.  Glade Lloyd, MD Phone: [phone]  Fax: [phone]   11/19/2015, 4:16 PM

## 2015-12-21 ENCOUNTER — Other Ambulatory Visit: Payer: Self-pay | Admitting: "Endocrinology

## 2016-03-17 LAB — LIPID PANEL
CHOLESTEROL: 141 mg/dL (ref 0–200)
HDL: 55 mg/dL (ref 35–70)
LDL CALC: 73 mg/dL
TRIGLYCERIDES: 53 mg/dL (ref 40–160)

## 2016-03-17 LAB — HEMOGLOBIN A1C: Hemoglobin A1C: 6.9

## 2016-03-21 ENCOUNTER — Other Ambulatory Visit: Payer: Self-pay | Admitting: "Endocrinology

## 2016-03-24 ENCOUNTER — Encounter: Payer: Self-pay | Admitting: "Endocrinology

## 2016-03-24 ENCOUNTER — Ambulatory Visit (INDEPENDENT_AMBULATORY_CARE_PROVIDER_SITE_OTHER): Payer: Managed Care, Other (non HMO) | Admitting: "Endocrinology

## 2016-03-24 VITALS — BP 120/78 | HR 72 | Ht 70.0 in | Wt 185.0 lb

## 2016-03-24 DIAGNOSIS — IMO0002 Reserved for concepts with insufficient information to code with codable children: Secondary | ICD-10-CM

## 2016-03-24 DIAGNOSIS — I10 Essential (primary) hypertension: Secondary | ICD-10-CM | POA: Diagnosis not present

## 2016-03-24 DIAGNOSIS — E782 Mixed hyperlipidemia: Secondary | ICD-10-CM | POA: Diagnosis not present

## 2016-03-24 DIAGNOSIS — E118 Type 2 diabetes mellitus with unspecified complications: Secondary | ICD-10-CM

## 2016-03-24 DIAGNOSIS — E1165 Type 2 diabetes mellitus with hyperglycemia: Secondary | ICD-10-CM

## 2016-03-24 NOTE — Progress Notes (Signed)
Subjective:    Patient ID: Darryl Mayer, male    DOB: 1955/10/20. Patient is being seen in Follow-up for management of diabetes requested by  TAPPER,DAVID B, MD  Past Medical History:  Diagnosis Date  . Diabetes mellitus, type II ([place])   . Hyperlipidemia   . Hypertension    Past Surgical History:  Procedure Laterality Date  . HEMORROIDECTOMY     Social History   Social History  . Marital status: Married    Spouse name: N/A  . Number of children: N/A  . Years of education: N/A   Social History Main Topics  . Smoking status: Never Smoker  . Smokeless tobacco: Never Used  . Alcohol use None  . Drug use: No  . Sexual activity: Not Asked   Other Topics Concern  . None   Social History Narrative  . None   Outpatient Encounter Prescriptions as of 03/24/2016  Medication Sig  . aspirin 81 MG tablet Take 81 mg by mouth daily.  Marland Kitchen atorvastatin (LIPITOR) 10 MG tablet Take 10 mg by mouth daily.  Marland Kitchen JARDIANCE 10 MG TABS tablet TAKE 1 TABLET DAILY  . lisinopril-hydrochlorothiazide (PRINZIDE,ZESTORETIC) 20-12.5 MG tablet Take 2 tablets by mouth daily.  . metFORMIN (GLUCOPHAGE) 1000 MG tablet Take 1,000 mg by mouth 2 (two) times daily with a meal.  . NIFEdipine (PROCARDIA XL/ADALAT-CC) 90 MG 24 hr tablet Take 90 mg by mouth daily.  Glory Rosebush VERIO test strip USE AS DIRECTED FOUR TIMES A DAY  . potassium chloride (K-DUR) 10 MEQ tablet Take 10 mEq by mouth daily.  . tamsulosin (FLOMAX) 0.4 MG CAPS capsule Take 0.8 mg by mouth at bedtime.   No facility-administered encounter medications on file as of 03/24/2016.    ALLERGIES: No Known Allergies VACCINATION STATUS:  There is no immunization history on file for this patient.  Diabetes  He presents for his follow-up diabetic visit. He has type 2 diabetes mellitus. Onset time: He was diagnosed at approximate age of 28 years. His disease course has been improving. There are no hypoglycemic associated symptoms. Pertinent negatives  for hypoglycemia include no confusion, headaches, pallor or seizures. Pertinent negatives for diabetes include no chest pain, no fatigue, no polydipsia, no polyphagia, no polyuria and no weakness. There are no hypoglycemic complications. Symptoms are improving. There are no diabetic complications. Risk factors for coronary artery disease include diabetes mellitus, dyslipidemia, male sex, hypertension and sedentary lifestyle. Current diabetic treatment includes oral agent (dual therapy). His weight is stable. He is following a generally unhealthy diet. When asked about meal planning, he reported none. He has not had a previous visit with a dietitian. He never participates in exercise. An ACE inhibitor/angiotensin II receptor blocker is being taken. Eye exam is current.  Hyperlipidemia  This is a chronic problem. The current episode started more than 1 year ago. Pertinent negatives include no chest pain, myalgias or shortness of breath. Current antihyperlipidemic treatment includes statins. Risk factors for coronary artery disease include diabetes mellitus, dyslipidemia, hypertension, male sex and a sedentary lifestyle.  Hypertension  This is a chronic problem. The current episode started more than 1 year ago. Pertinent negatives include no chest pain, headaches, neck pain, palpitations or shortness of breath. Risk factors for coronary artery disease include dyslipidemia, diabetes mellitus, male gender and sedentary lifestyle. Past treatments include ACE inhibitors.   Review of Systems  Constitutional: Negative for chills, fatigue, fever and unexpected weight change.  HENT: Negative for dental problem, mouth sores and trouble swallowing.  Eyes: Negative for visual disturbance.  Respiratory: Negative for cough, choking, chest tightness, shortness of breath and wheezing.   Cardiovascular: Negative for chest pain, palpitations and leg swelling.  Gastrointestinal: Negative for abdominal distention, abdominal  pain, constipation, diarrhea, nausea and vomiting.  Endocrine: Negative for polydipsia, polyphagia and polyuria.  Genitourinary: Negative for dysuria, flank pain, hematuria and urgency.  Musculoskeletal: Negative for back pain, gait problem, myalgias and neck pain.  Skin: Negative for pallor, rash and wound.  Neurological: Negative for seizures, syncope, weakness, numbness and headaches.  Psychiatric/Behavioral: Negative.  Negative for confusion and dysphoric mood.    Objective:    BP 120/78   Pulse 72   Ht 5\' 10"  (1.778 m)   Wt 185 lb (83.9 kg)   BMI 26.54 kg/m   Wt Readings from Last 3 Encounters:  03/24/16 185 lb (83.9 kg)  11/19/15 183 lb (83 kg)  08/17/15 190 lb (86.2 kg)    Physical Exam  Constitutional: He is oriented to person, place, and time. He appears well-developed and well-nourished. He is cooperative. No distress.  HENT:  Head: Normocephalic and atraumatic.  Eyes: EOM are normal.  Neck: Normal range of motion. Neck supple. No tracheal deviation present. No thyromegaly present.  Cardiovascular: Normal rate, S1 normal, S2 normal and normal heart sounds.  Exam reveals no gallop.   No murmur heard. Pulses:      Dorsalis pedis pulses are 1+ on the right side, and 1+ on the left side.       Posterior tibial pulses are 1+ on the right side, and 1+ on the left side.  Pulmonary/Chest: Breath sounds normal. No respiratory distress. He has no wheezes.  Abdominal: Soft. Bowel sounds are normal. He exhibits no distension. There is no tenderness. There is no guarding and no CVA tenderness.  Musculoskeletal: He exhibits no edema.       Right shoulder: He exhibits no swelling and no deformity.  Neurological: He is alert and oriented to person, place, and time. He has normal strength and normal reflexes. No cranial nerve deficit or sensory deficit. Gait normal.  Skin: Skin is warm and dry. No rash noted. No cyanosis. Nails show no clubbing.  Psychiatric: He has a normal mood and  affect. His speech is normal and behavior is normal. Judgment and thought content normal. Cognition and memory are normal.     Assessment & Plan:   1. Uncontrolled type 2 diabetes mellitus with complication, without long-term current use of insulin ([place])  - Patient has currently uncontrolled symptomatic type 2 DM since  61 years of age,  with most recent A1c of 6.9% , Progressively improving from 8.4%.   - No reported gross complications, however patient remains at a high risk for more acute and chronic complications of diabetes which include CAD, CVA, CKD, retinopathy, and neuropathy. These are all discussed in detail with the patient.  - I have counseled the patient on diet management and weight loss, by adopting a carbohydrate restricted/protein rich diet.  - Suggestion is made for patient to avoid simple carbohydrates   from their diet including Cakes , Desserts, Ice Cream,  Soda (  diet and regular) , Sweet Tea , Candies,  Chips, Cookies, Artificial Sweeteners,   and "Sugar-free" Products . This will help patient to have stable blood glucose profile and potentially avoid unintended weight gain.  - I encouraged the patient to switch to  unprocessed or minimally processed complex starch and increased protein intake (animal or plant source), fruits,  and vegetables.  - Patient is advised to stick to a routine mealtimes to eat 3 meals  a day and avoid unnecessary snacks ( to snack only to correct hypoglycemia).  - The patient will be scheduled with Jearld Fenton, RDN, CDE for individualized DM education.  - I have approached patient with the following individualized plan to manage diabetes and patient agrees:   - Based on his progress, he will not need insulin treatment for now. I will continue metformin 1000 mg by mouth twice a day, therapeutically suitable for patient.  -I will continue Jardiance 10 mg by mouth every a.m. Side effects and precautions discussed with him.  - Patient will  be considered for incretin therapy as appropriate next visit. - Patient specific target  A1c;  LDL, HDL, Triglycerides, and  Waist Circumference were discussed in detail.  2) BP/HTN: Controlled. Continue current medications including ACEI/ARB. 3) Lipids/HPL:  Controlled, LDL 75.  continue statins. 4)  Weight/Diet: CDE Consult will be initiated , exercise, and detailed carbohydrates information provided.  5) Chronic Care/Health Maintenance:  -Patient is on ACEI/ARB and Statin medications and encouraged to continue to follow up with Ophthalmology, Podiatrist at least yearly or according to recommendations, and advised to   stay away from smoking. I have recommended yearly flu vaccine and pneumonia vaccination at least every 5 years; moderate intensity exercise for up to 150 minutes weekly; and  sleep for at least 7 hours a day.  - 25 minutes of time was spent on the care of this patient , 50% of which was applied for counseling on diabetes complications and their preventions.  - Patient to bring meter and  blood glucose logs during their next visit.   - I advised patient to maintain close follow up with TAPPER,DAVID B, MD for primary care needs.  Follow up plan: - Return in about 4 months (around 07/22/2016) for follow up with pre-visit labs.  Glade Lloyd, MD Phone: [phone]  Fax: [phone]   03/24/2016, 3:55 PM

## 2016-06-19 ENCOUNTER — Other Ambulatory Visit: Payer: Self-pay | Admitting: "Endocrinology

## 2016-07-12 LAB — BASIC METABOLIC PANEL
BUN: 20 (ref 4–21)
Creatinine: 1.3 (ref 0.6–1.3)

## 2016-07-12 LAB — HEMOGLOBIN A1C: Hemoglobin A1C: 7.1

## 2016-07-25 ENCOUNTER — Ambulatory Visit: Payer: Managed Care, Other (non HMO) | Admitting: "Endocrinology

## 2016-07-25 ENCOUNTER — Encounter: Payer: Self-pay | Admitting: "Endocrinology

## 2016-09-05 ENCOUNTER — Encounter: Payer: Self-pay | Admitting: "Endocrinology

## 2016-09-05 ENCOUNTER — Ambulatory Visit (INDEPENDENT_AMBULATORY_CARE_PROVIDER_SITE_OTHER): Payer: Managed Care, Other (non HMO) | Admitting: "Endocrinology

## 2016-09-05 VITALS — BP 119/79 | HR 76 | Ht 70.0 in | Wt 162.8 lb

## 2016-09-05 DIAGNOSIS — E1165 Type 2 diabetes mellitus with hyperglycemia: Secondary | ICD-10-CM | POA: Diagnosis not present

## 2016-09-05 DIAGNOSIS — IMO0002 Reserved for concepts with insufficient information to code with codable children: Secondary | ICD-10-CM

## 2016-09-05 DIAGNOSIS — E118 Type 2 diabetes mellitus with unspecified complications: Secondary | ICD-10-CM

## 2016-09-05 DIAGNOSIS — I10 Essential (primary) hypertension: Secondary | ICD-10-CM

## 2016-09-05 DIAGNOSIS — E782 Mixed hyperlipidemia: Secondary | ICD-10-CM

## 2016-09-05 MED ORDER — METFORMIN HCL 500 MG PO TABS: 500.0000 mg | ORAL_TABLET | Freq: Two times a day (BID) | ORAL | 3 refills | Status: DC

## 2016-09-05 NOTE — Progress Notes (Signed)
Subjective:    Patient ID: Darryl Mayer, male    DOB: 1955-09-21. Patient is being seen in Follow-up for management of diabetes requested by  Deloria Lair., MD  Past Medical History:  Diagnosis Date  . Diabetes mellitus, type II ([place])   . Hyperlipidemia   . Hypertension    Past Surgical History:  Procedure Laterality Date  . HEMORROIDECTOMY     Social History   Social History  . Marital status: Married    Spouse name: N/A  . Number of children: N/A  . Years of education: N/A   Social History Main Topics  . Smoking status: Never Smoker  . Smokeless tobacco: Never Used  . Alcohol use Not on file  . Drug use: No  . Sexual activity: Not on file   Other Topics Concern  . Not on file   Social History Narrative  . No narrative on file   Outpatient Encounter Prescriptions as of 09/05/2016  Medication Sig  . aspirin 81 MG tablet Take 81 mg by mouth daily.  Marland Kitchen atorvastatin (LIPITOR) 10 MG tablet Take 10 mg by mouth daily.  Marland Kitchen lisinopril-hydrochlorothiazide (PRINZIDE,ZESTORETIC) 20-12.5 MG tablet Take 2 tablets by mouth daily.  . metFORMIN (GLUCOPHAGE) 500 MG tablet Take 1 tablet (500 mg total) by mouth 2 (two) times daily with a meal.  . ONETOUCH VERIO test strip USE AS DIRECTED FOUR TIMES A DAY  . potassium chloride (K-DUR) 10 MEQ tablet Take 10 mEq by mouth daily.  . tamsulosin (FLOMAX) 0.4 MG CAPS capsule Take 0.8 mg by mouth at bedtime.  . [DISCONTINUED] JARDIANCE 10 MG TABS tablet TAKE 1 TABLET DAILY  . [DISCONTINUED] metFORMIN (GLUCOPHAGE) 1000 MG tablet Take 1,000 mg by mouth 2 (two) times daily with a meal.  . [DISCONTINUED] NIFEdipine (PROCARDIA XL/ADALAT-CC) 90 MG 24 hr tablet Take 90 mg by mouth daily.   No facility-administered encounter medications on file as of 09/05/2016.    ALLERGIES: No Known Allergies VACCINATION STATUS:  There is no immunization history on file for this patient.  Diabetes  He presents for his follow-up diabetic visit. He has  type 2 diabetes mellitus. Onset time: He was diagnosed at approximate age of 24 years. His disease course has been improving. There are no hypoglycemic associated symptoms. Pertinent negatives for hypoglycemia include no confusion, headaches, pallor or seizures. Pertinent negatives for diabetes include no chest pain, no fatigue, no polydipsia, no polyphagia, no polyuria and no weakness. There are no hypoglycemic complications. Symptoms are improving. There are no diabetic complications. Risk factors for coronary artery disease include diabetes mellitus, dyslipidemia, male sex, hypertension and sedentary lifestyle. Current diabetic treatment includes oral agent (dual therapy). His weight is stable. He is following a generally unhealthy diet. When asked about meal planning, he reported none. He has not had a previous visit with a dietitian. He never participates in exercise. An ACE inhibitor/angiotensin II receptor blocker is being taken. Eye exam is current.  Hyperlipidemia  This is a chronic problem. The current episode started more than 1 year ago. Pertinent negatives include no chest pain, myalgias or shortness of breath. Current antihyperlipidemic treatment includes statins. Risk factors for coronary artery disease include diabetes mellitus, dyslipidemia, hypertension, male sex and a sedentary lifestyle.  Hypertension  This is a chronic problem. The current episode started more than 1 year ago. Pertinent negatives include no chest pain, headaches, neck pain, palpitations or shortness of breath. Risk factors for coronary artery disease include dyslipidemia, diabetes mellitus, male gender and  sedentary lifestyle. Past treatments include ACE inhibitors.   Review of Systems  Constitutional: Negative for chills, fatigue, fever and unexpected weight change.  HENT: Negative for dental problem, mouth sores and trouble swallowing.   Eyes: Negative for visual disturbance.  Respiratory: Negative for cough,  choking, chest tightness, shortness of breath and wheezing.   Cardiovascular: Negative for chest pain, palpitations and leg swelling.  Gastrointestinal: Negative for abdominal distention, abdominal pain, constipation, diarrhea, nausea and vomiting.  Endocrine: Negative for polydipsia, polyphagia and polyuria.  Genitourinary: Negative for dysuria, flank pain, hematuria and urgency.  Musculoskeletal: Negative for back pain, gait problem, myalgias and neck pain.  Skin: Negative for pallor, rash and wound.  Neurological: Negative for seizures, syncope, weakness, numbness and headaches.  Psychiatric/Behavioral: Negative.  Negative for confusion and dysphoric mood.    Objective:    BP 119/79 (BP Location: Left Arm, Patient Position: Sitting, Cuff Size: Normal)   Pulse 76   Ht 5\' 10"  (1.778 m)   Wt 162 lb 12.8 oz (73.8 kg)   SpO2 98%   BMI 23.36 kg/m   Wt Readings from Last 3 Encounters:  09/05/16 162 lb 12.8 oz (73.8 kg)  03/24/16 185 lb (83.9 kg)  11/19/15 183 lb (83 kg)    Physical Exam  Constitutional: He is oriented to person, place, and time. He appears well-developed and well-nourished. He is cooperative. No distress.  HENT:  Head: Normocephalic and atraumatic.  Eyes: EOM are normal.  Neck: Normal range of motion. Neck supple. No tracheal deviation present. No thyromegaly present.  Cardiovascular: Normal rate, S1 normal, S2 normal and normal heart sounds.  Exam reveals no gallop.   No murmur heard. Pulses:      Dorsalis pedis pulses are 1+ on the right side, and 1+ on the left side.       Posterior tibial pulses are 1+ on the right side, and 1+ on the left side.  Pulmonary/Chest: Breath sounds normal. No respiratory distress. He has no wheezes.  Abdominal: Soft. Bowel sounds are normal. He exhibits no distension. There is no tenderness. There is no guarding and no CVA tenderness.  Musculoskeletal: He exhibits no edema.       Right shoulder: He exhibits no swelling and no  deformity.  Neurological: He is alert and oriented to person, place, and time. He has normal strength and normal reflexes. No cranial nerve deficit or sensory deficit. Gait normal.  Skin: Skin is warm and dry. No rash noted. No cyanosis. Nails show no clubbing.  Psychiatric: He has a normal mood and affect. His speech is normal and behavior is normal. Judgment and thought content normal. Cognition and memory are normal.   Recent Results (from the past 2160 hour(s))  Basic metabolic panel     Status: None   Collection Time: 07/12/16 12:00 AM  Result Value Ref Range   BUN 20 4 - 21   Creatinine 1.3 0.6 - 1.3  Hemoglobin A1c     Status: None   Collection Time: 07/12/16 12:00 AM  Result Value Ref Range   Hemoglobin A1C 7.1      Assessment & Plan:   1. Uncontrolled type 2 diabetes mellitus with complication, without long-term current use of insulin ([place])  - Patient has currently uncontrolled symptomatic type 2 DM since  61 years of age,  with most recent A1c  Stable at 7.1%,  Progressively improving from 8.4%.   - No reported gross complications, however patient remains at a high risk for more acute  and chronic complications of diabetes which include CAD, CVA, CKD, retinopathy, and neuropathy. These are all discussed in detail with the patient.  - I have counseled the patient on diet management and weight loss, by adopting a carbohydrate restricted/protein rich diet.  - Suggestion is made for patient to avoid simple carbohydrates   from his diet including Cakes , Desserts, Ice Cream,  Soda (  diet and regular) , Sweet Tea , Candies,  Chips, Cookies, Artificial Sweeteners,   and "Sugar-free" Products . This will help patient to have stable blood glucose profile and potentially avoid unintended weight gain.  - I encouraged the patient to switch to  unprocessed or minimally processed complex starch and increased protein intake (animal or plant source), fruits, and vegetables.  - Patient is  advised to stick to a routine mealtimes to eat 3 meals  a day and avoid unnecessary snacks ( to snack only to correct hypoglycemia).   - I have approached patient with the following individualized plan to manage diabetes and patient agrees:   - Based on his progress, he will not need insulin treatment for now. I will continue metformin 1000 mg by mouth twice a day, therapeutically suitable for patient.  -I will discontinue Jardiance for now.   -  Since his last visit, reportedly he underwent abdominal surgery to remove some mass lesion-he states its benign however officer report is not available to review. I have asked him to request for a copy of his surgical reports for review.   - Patient specific target  A1c;  LDL, HDL, Triglycerides, and  Waist Circumference were discussed in detail.  2) BP/HTN: Controlled. Continue current medications including ACEI/ARB. I advised him to discontinue the phentermine. 3) Lipids/HPL:  Controlled, LDL 75.  continue statins. 4)  Weight/Diet: CDE Consult will be initiated , exercise, and detailed carbohydrates information provided. - He lost close to 30 pounds overall some of which is related to his recent surgery.  5) Chronic Care/Health Maintenance:  -Patient is on ACEI/ARB and Statin medications and encouraged to continue to follow up with Ophthalmology, Podiatrist at least yearly or according to recommendations, and advised to   stay away from smoking. I have recommended yearly flu vaccine and pneumonia vaccination at least every 5 years; moderate intensity exercise for up to 150 minutes weekly; and  sleep for at least 7 hours a day.  - 20 minutes of time was spent on the care of this patient , 50% of which was applied for counseling on diabetes complications and their preventions.  - Patient to bring meter and  blood glucose logs during his next visit.   - I advised patient to maintain close follow up with Deloria Lair., MD for primary care  needs.  Follow up plan: - Return in about 3 months (around 12/06/2016) for follow up with pre-visit labs.  Glade Lloyd, MD Phone: [phone]  Fax: [phone]   09/05/2016, 4:21 PM

## 2016-09-16 ENCOUNTER — Ambulatory Visit: Payer: Managed Care, Other (non HMO) | Admitting: "Endocrinology

## 2016-10-24 DIAGNOSIS — D126 Benign neoplasm of colon, unspecified: Secondary | ICD-10-CM | POA: Insufficient documentation

## 2016-11-11 DIAGNOSIS — R350 Frequency of micturition: Secondary | ICD-10-CM | POA: Insufficient documentation

## 2016-11-15 ENCOUNTER — Other Ambulatory Visit: Payer: Self-pay | Admitting: "Endocrinology

## 2016-12-06 LAB — BASIC METABOLIC PANEL
BUN: 20 (ref 4–21)
Creatinine: 1.2 (ref <=1.3)

## 2016-12-06 LAB — HEMOGLOBIN A1C: Hemoglobin A1C: 6.8

## 2016-12-09 ENCOUNTER — Ambulatory Visit (INDEPENDENT_AMBULATORY_CARE_PROVIDER_SITE_OTHER): Payer: Managed Care, Other (non HMO) | Admitting: "Endocrinology

## 2016-12-09 ENCOUNTER — Encounter: Payer: Self-pay | Admitting: "Endocrinology

## 2016-12-09 VITALS — BP 160/100 | HR 66 | Ht 70.0 in | Wt 166.2 lb

## 2016-12-09 DIAGNOSIS — IMO0002 Reserved for concepts with insufficient information to code with codable children: Secondary | ICD-10-CM

## 2016-12-09 DIAGNOSIS — I10 Essential (primary) hypertension: Secondary | ICD-10-CM | POA: Diagnosis not present

## 2016-12-09 DIAGNOSIS — E118 Type 2 diabetes mellitus with unspecified complications: Secondary | ICD-10-CM

## 2016-12-09 DIAGNOSIS — E1165 Type 2 diabetes mellitus with hyperglycemia: Secondary | ICD-10-CM

## 2016-12-09 DIAGNOSIS — R519 Headache, unspecified: Secondary | ICD-10-CM | POA: Insufficient documentation

## 2016-12-09 DIAGNOSIS — E782 Mixed hyperlipidemia: Secondary | ICD-10-CM

## 2016-12-09 NOTE — Progress Notes (Signed)
Subjective:    Patient ID: Darryl Mayer, male    DOB: February 03, 1956. Patient is being seen in Follow-up for management of diabetes requested by  Deloria Lair., MD  Past Medical History:  Diagnosis Date  . Diabetes mellitus, type II ([place])   . Hyperlipidemia   . Hypertension    Past Surgical History:  Procedure Laterality Date  . HEMORROIDECTOMY     Social History   Social History  . Marital status: Married    Spouse name: N/A  . Number of children: N/A  . Years of education: N/A   Social History Main Topics  . Smoking status: Never Smoker  . Smokeless tobacco: Never Used  . Alcohol use None  . Drug use: No  . Sexual activity: Not Asked   Other Topics Concern  . None   Social History Narrative  . None   Outpatient Encounter Prescriptions as of 12/09/2016  Medication Sig  . aspirin 81 MG tablet Take 81 mg by mouth daily.  Marland Kitchen atorvastatin (LIPITOR) 10 MG tablet Take 10 mg by mouth daily.  Marland Kitchen lisinopril-hydrochlorothiazide (PRINZIDE,ZESTORETIC) 20-12.5 MG tablet Take 2 tablets by mouth daily.  . metFORMIN (GLUCOPHAGE) 500 MG tablet Take 1 tablet (500 mg total) by mouth 2 (two) times daily with a meal.  . NIFEdipine (ADALAT CC) 90 MG 24 hr tablet Take 90 mg by mouth daily.  Glory Rosebush VERIO test strip USE AS DIRECTED FOUR TIMES A DAY  . potassium chloride (K-DUR) 10 MEQ tablet Take 10 mEq by mouth daily.  . tamsulosin (FLOMAX) 0.4 MG CAPS capsule Take 0.8 mg by mouth at bedtime.   No facility-administered encounter medications on file as of 12/09/2016.    ALLERGIES: No Known Allergies VACCINATION STATUS:  There is no immunization history on file for this patient.  Diabetes  He presents for his follow-up diabetic visit. He has type 2 diabetes mellitus. Onset time: He was diagnosed at approximate age of 71 years. His disease course has been improving. There are no hypoglycemic associated symptoms. Pertinent negatives for hypoglycemia include no confusion, headaches,  pallor or seizures. Pertinent negatives for diabetes include no chest pain, no fatigue, no polydipsia, no polyphagia, no polyuria and no weakness. There are no hypoglycemic complications. Symptoms are improving. There are no diabetic complications. Risk factors for coronary artery disease include diabetes mellitus, dyslipidemia, male sex, hypertension and sedentary lifestyle. Current diabetic treatment includes oral agent (dual therapy). His weight is stable. He is following a generally unhealthy diet. When asked about meal planning, he reported none. He has not had a previous visit with a dietitian. He never participates in exercise. An ACE inhibitor/angiotensin II receptor blocker is being taken. Eye exam is current.  Hyperlipidemia  This is a chronic problem. The current episode started more than 1 year ago. Pertinent negatives include no chest pain, myalgias or shortness of breath. Current antihyperlipidemic treatment includes statins. Risk factors for coronary artery disease include diabetes mellitus, dyslipidemia, hypertension, male sex and a sedentary lifestyle.  Hypertension  This is a chronic problem. The current episode started more than 1 year ago. Pertinent negatives include no chest pain, headaches, neck pain, palpitations or shortness of breath. Risk factors for coronary artery disease include dyslipidemia, diabetes mellitus, male gender and sedentary lifestyle. Past treatments include ACE inhibitors.   Review of Systems  Constitutional: Negative for chills, fatigue, fever and unexpected weight change.  HENT: Negative for dental problem, mouth sores and trouble swallowing.   Eyes: Negative for visual disturbance.  Respiratory: Negative for cough, choking, chest tightness, shortness of breath and wheezing.   Cardiovascular: Negative for chest pain, palpitations and leg swelling.  Gastrointestinal: Negative for abdominal distention, abdominal pain, constipation, diarrhea, nausea and vomiting.   Endocrine: Negative for polydipsia, polyphagia and polyuria.  Genitourinary: Negative for dysuria, flank pain, hematuria and urgency.  Musculoskeletal: Negative for back pain, gait problem, myalgias and neck pain.  Skin: Negative for pallor, rash and wound.  Neurological: Negative for seizures, syncope, weakness, numbness and headaches.  Psychiatric/Behavioral: Negative.  Negative for confusion and dysphoric mood.    Objective:    BP (!) 160/100   Pulse 66   Ht 5\' 10"  (1.778 m)   Wt 166 lb 3.2 oz (75.4 kg)   BMI 23.85 kg/m   Wt Readings from Last 3 Encounters:  12/09/16 166 lb 3.2 oz (75.4 kg)  09/05/16 162 lb 12.8 oz (73.8 kg)  03/24/16 185 lb (83.9 kg)    Physical Exam  Constitutional: He is oriented to person, place, and time. He appears well-developed and well-nourished. He is cooperative. No distress.  HENT:  Head: Normocephalic and atraumatic.  Eyes: EOM are normal.  Neck: Normal range of motion. Neck supple. No tracheal deviation present. No thyromegaly present.  Cardiovascular: Normal rate, S1 normal, S2 normal and normal heart sounds.  Exam reveals no gallop.   No murmur heard. Pulses:      Dorsalis pedis pulses are 1+ on the right side, and 1+ on the left side.       Posterior tibial pulses are 1+ on the right side, and 1+ on the left side.  Pulmonary/Chest: Breath sounds normal. No respiratory distress. He has no wheezes.  Abdominal: Soft. Bowel sounds are normal. He exhibits no distension. There is no tenderness. There is no guarding and no CVA tenderness.  Musculoskeletal: He exhibits no edema.       Right shoulder: He exhibits no swelling and no deformity.  Neurological: He is alert and oriented to person, place, and time. He has normal strength and normal reflexes. No cranial nerve deficit or sensory deficit. Gait normal.  Skin: Skin is warm and dry. No rash noted. No cyanosis. Nails show no clubbing.  Psychiatric: He has a normal mood and affect. His speech is  normal and behavior is normal. Judgment and thought content normal. Cognition and memory are normal.   Recent Results (from the past 2160 hour(s))  Basic metabolic panel     Status: None   Collection Time: 12/06/16 12:00 AM  Result Value Ref Range   BUN 20 4 - 21   Creatinine 1.2 0.6 - 1.3  Hemoglobin A1c     Status: None   Collection Time: 12/06/16 12:00 AM  Result Value Ref Range   Hemoglobin A1C 6.8      Assessment & Plan:   1. Uncontrolled type 2 diabetes mellitus with complication, without long-term current use of insulin ([place])  - Patient has currently uncontrolled symptomatic type 2 DM since  61 years of age,  with most recent A1c  Stable at 6.8%,  Progressively improving from 8.4%.   - No reported gross complications, however patient remains at a high risk for more acute and chronic complications of diabetes which include CAD, CVA, CKD, retinopathy, and neuropathy. These are all discussed in detail with the patient.  - I have counseled the patient on diet management and weight loss, by adopting a carbohydrate restricted/protein rich diet.  -  Suggestion is made for him to avoid simple  carbohydrates  from his diet including Cakes, Sweet Desserts / Pastries, Ice Cream, Soda (diet and regular), Sweet Tea, Candies, Chips, Cookies, Store Bought Juices, Alcohol in Excess of  1-2 drinks a day, Artificial Sweeteners, and "Sugar-free" Products. This will help patient to have stable blood glucose profile and potentially avoid unintended weight gain.   - I encouraged the patient to switch to  unprocessed or minimally processed complex starch and increased protein intake (animal or plant source), fruits, and vegetables.  - Patient is advised to stick to a routine mealtimes to eat 3 meals  a day and avoid unnecessary snacks ( to snack only to correct hypoglycemia).   - I have approached patient with the following individualized plan to manage diabetes and patient agrees:   - Based on his  progress, he will not need insulin treatment for now. I will continue metformin 1000 mg by mouth twice a day, therapeutically suitable for patient.  - Patient specific target  A1c;  LDL, HDL, Triglycerides, and  Waist Circumference were discussed in detail.  2) BP/HTN: uncontrolled. Continue current medications including ACEI/ARB.  I advised him to resume his significant 90 mg by mouth daily along with his lisinopril/hydrochlorothiazide. 3) Lipids/HPL:  Controlled, LDL 75.  continue statins. 4)  Weight/Diet: CDE Consult will be initiated , exercise, and detailed carbohydrates information provided. - He lost close to 30 pounds overall some of which is related to his recent surgery.  5) Chronic Care/Health Maintenance:  -Patient is on ACEI/ARB and Statin medications and encouraged to continue to follow up with Ophthalmology, Podiatrist at least yearly or according to recommendations, and advised to   stay away from smoking. I have recommended yearly flu vaccine and pneumonia vaccination at least every 5 years; moderate intensity exercise for up to 150 minutes weekly; and  sleep for at least 7 hours a day.  - I advised patient to maintain close follow up with Deloria Lair., MD for primary care needs.  Follow up plan: - Return in about 3 months (around 03/11/2017) for meter, and logs.  Glade Lloyd, MD Phone: [phone]  Fax: [phone]  -  This note was partially dictated with voice recognition software. Similar sounding words can be transcribed inadequately or may not  be corrected upon review.  12/09/2016, 12:53 PM

## 2017-03-17 ENCOUNTER — Ambulatory Visit: Payer: Managed Care, Other (non HMO) | Admitting: "Endocrinology

## 2017-03-17 LAB — TSH: TSH: 2.44 (ref <=5.90)

## 2017-03-17 LAB — HEMOGLOBIN A1C: Hemoglobin A1C: 8.1

## 2017-03-17 LAB — BASIC METABOLIC PANEL
BUN: 19 (ref 4–21)
Creatinine: 1.3 (ref <=1.3)

## 2017-03-17 LAB — VITAMIN D 25 HYDROXY (VIT D DEFICIENCY, FRACTURES): Vit D, 25-Hydroxy: 33

## 2017-03-17 LAB — LIPID PANEL
CHOLESTEROL: 135 (ref 0–200)
HDL: 53 (ref 35–70)
LDL CALC: 67
Triglycerides: 72 (ref 40–160)

## 2017-03-22 ENCOUNTER — Ambulatory Visit (INDEPENDENT_AMBULATORY_CARE_PROVIDER_SITE_OTHER): Payer: Managed Care, Other (non HMO) | Admitting: "Endocrinology

## 2017-03-22 ENCOUNTER — Encounter: Payer: Self-pay | Admitting: "Endocrinology

## 2017-03-22 VITALS — BP 148/80 | HR 68 | Ht 70.0 in | Wt 190.0 lb

## 2017-03-22 DIAGNOSIS — I10 Essential (primary) hypertension: Secondary | ICD-10-CM | POA: Diagnosis not present

## 2017-03-22 DIAGNOSIS — E118 Type 2 diabetes mellitus with unspecified complications: Secondary | ICD-10-CM

## 2017-03-22 DIAGNOSIS — E1165 Type 2 diabetes mellitus with hyperglycemia: Secondary | ICD-10-CM

## 2017-03-22 DIAGNOSIS — E782 Mixed hyperlipidemia: Secondary | ICD-10-CM

## 2017-03-22 DIAGNOSIS — IMO0002 Reserved for concepts with insufficient information to code with codable children: Secondary | ICD-10-CM

## 2017-03-22 MED ORDER — LINAGLIPTIN 5 MG PO TABS: 5.0000 mg | ORAL_TABLET | Freq: Every day | ORAL | 3 refills | Status: DC

## 2017-03-22 NOTE — Patient Instructions (Signed)

## 2017-03-22 NOTE — Progress Notes (Signed)
Subjective:    Patient ID: Darryl Mayer, male    DOB: Mar 20, 1955. Patient is being seen in Follow-up for management of diabetes requested by  Deloria Lair., MD  Past Medical History:  Diagnosis Date  . Diabetes mellitus, type II ([place])   . Hyperlipidemia   . Hypertension    Past Surgical History:  Procedure Laterality Date  . HEMORROIDECTOMY     Social History   Socioeconomic History  . Marital status: Married    Spouse name: None  . Number of children: None  . Years of education: None  . Highest education level: None  Social Needs  . Financial resource strain: None  . Food insecurity - worry: None  . Food insecurity - inability: None  . Transportation needs - medical: None  . Transportation needs - non-medical: None  Occupational History  . None  Tobacco Use  . Smoking status: Never Smoker  . Smokeless tobacco: Never Used  Substance and Sexual Activity  . Alcohol use: None  . Drug use: No  . Sexual activity: None  Other Topics Concern  . None  Social History Narrative  . None   Outpatient Encounter Medications as of 03/22/2017  Medication Sig  . aspirin 81 MG tablet Take 81 mg by mouth daily.  Marland Kitchen atorvastatin (LIPITOR) 10 MG tablet Take 10 mg by mouth daily.  Marland Kitchen ibuprofen (ADVIL,MOTRIN) 800 MG tablet 3 (three) times daily.  Marland Kitchen linagliptin (TRADJENTA) 5 MG TABS tablet Take 1 tablet (5 mg total) by mouth daily.  Marland Kitchen lisinopril-hydrochlorothiazide (PRINZIDE,ZESTORETIC) 20-12.5 MG tablet Take 2 tablets by mouth daily.  . metFORMIN (GLUCOPHAGE) 500 MG tablet Take 1 tablet (500 mg total) by mouth 2 (two) times daily with a meal.  . NIFEdipine (ADALAT CC) 90 MG 24 hr tablet Take 90 mg by mouth daily.  Glory Rosebush VERIO test strip USE AS DIRECTED FOUR TIMES A DAY  . penicillin v potassium (VEETID) 500 MG tablet 3 (three) times daily.  . potassium chloride (K-DUR) 10 MEQ tablet Take 10 mEq by mouth daily.  . tamsulosin (FLOMAX) 0.4 MG CAPS capsule Take 0.8 mg by mouth  at bedtime.   No facility-administered encounter medications on file as of 03/22/2017.    ALLERGIES: No Known Allergies VACCINATION STATUS:  There is no immunization history on file for this patient.  Diabetes  He presents for his follow-up diabetic visit. He has type 2 diabetes mellitus. Onset time: He was diagnosed at approximate age of 18 years. His disease course has been worsening. There are no hypoglycemic associated symptoms. Pertinent negatives for hypoglycemia include no confusion, headaches, pallor or seizures. Associated symptoms include polydipsia and polyuria. Pertinent negatives for diabetes include no chest pain, no fatigue, no polyphagia and no weakness. There are no hypoglycemic complications. Symptoms are worsening. There are no diabetic complications. Risk factors for coronary artery disease include diabetes mellitus, dyslipidemia, male sex, hypertension and sedentary lifestyle. Current diabetic treatment includes oral agent (dual therapy). His weight is increasing rapidly. He is following a generally unhealthy diet. When asked about meal planning, he reported none. He has not had a previous visit with a dietitian. He never participates in exercise. An ACE inhibitor/angiotensin II receptor blocker is being taken. Eye exam is current.  Hyperlipidemia  This is a chronic problem. The current episode started more than 1 year ago. The problem is controlled. Exacerbating diseases include diabetes. Pertinent negatives include no chest pain, myalgias or shortness of breath. Current antihyperlipidemic treatment includes statins. Risk factors  for coronary artery disease include diabetes mellitus, dyslipidemia, hypertension, male sex and a sedentary lifestyle.  Hypertension  This is a chronic problem. The current episode started more than 1 year ago. The problem is uncontrolled. Pertinent negatives include no chest pain, headaches, neck pain, palpitations or shortness of breath. Risk factors  for coronary artery disease include dyslipidemia, diabetes mellitus, male gender and sedentary lifestyle. Past treatments include ACE inhibitors.   Review of Systems  Constitutional: Negative for chills, fatigue, fever and unexpected weight change.  HENT: Negative for dental problem, mouth sores and trouble swallowing.   Eyes: Negative for visual disturbance.  Respiratory: Negative for cough, choking, chest tightness, shortness of breath and wheezing.   Cardiovascular: Negative for chest pain, palpitations and leg swelling.  Gastrointestinal: Negative for abdominal distention, abdominal pain, constipation, diarrhea, nausea and vomiting.  Endocrine: Positive for polydipsia and polyuria. Negative for polyphagia.  Genitourinary: Negative for dysuria, flank pain, hematuria and urgency.  Musculoskeletal: Negative for back pain, gait problem, myalgias and neck pain.  Skin: Negative for pallor, rash and wound.  Neurological: Negative for seizures, syncope, weakness, numbness and headaches.  Psychiatric/Behavioral: Negative.  Negative for confusion and dysphoric mood.    Objective:    BP (!) 148/80   Pulse 68   Ht 5\' 10"  (1.778 m)   Wt 190 lb (86.2 kg)   BMI 27.26 kg/m   Wt Readings from Last 3 Encounters:  03/22/17 190 lb (86.2 kg)  12/09/16 166 lb 3.2 oz (75.4 kg)  09/05/16 162 lb 12.8 oz (73.8 kg)    Physical Exam  Constitutional: He is oriented to person, place, and time. He appears well-developed and well-nourished. He is cooperative. No distress.  HENT:  Head: Normocephalic and atraumatic.  Eyes: EOM are normal.  Neck: Normal range of motion. Neck supple. No tracheal deviation present. No thyromegaly present.  Cardiovascular: Normal rate, S1 normal, S2 normal and normal heart sounds. Exam reveals no gallop.  No murmur heard. Pulses:      Dorsalis pedis pulses are 1+ on the right side, and 1+ on the left side.       Posterior tibial pulses are 1+ on the right side, and 1+ on the  left side.  Pulmonary/Chest: Breath sounds normal. No respiratory distress. He has no wheezes.  Abdominal: Soft. Bowel sounds are normal. He exhibits no distension. There is no tenderness. There is no guarding and no CVA tenderness.  Musculoskeletal: He exhibits no edema.       Right shoulder: He exhibits no swelling and no deformity.  Neurological: He is alert and oriented to person, place, and time. He has normal strength and normal reflexes. No cranial nerve deficit or sensory deficit. Gait normal.  Skin: Skin is warm and dry. No rash noted. No cyanosis. Nails show no clubbing.  Psychiatric: He has a normal mood and affect. His speech is normal and behavior is normal. Judgment and thought content normal. Cognition and memory are normal.   Recent Results (from the past 2160 hour(s))  VITAMIN D 25 Hydroxy (Vit-D Deficiency, Fractures)     Status: None   Collection Time: 03/17/17 12:00 AM  Result Value Ref Range   Vit D, 25-Hydroxy 33   Basic metabolic panel     Status: None   Collection Time: 03/17/17 12:00 AM  Result Value Ref Range   BUN 19 4 - 21   Creatinine 1.3 0.6 - 1.3  Lipid panel     Status: None   Collection Time: 03/17/17 12:00  AM  Result Value Ref Range   Triglycerides 72 40 - 160   Cholesterol 135 0 - 200   HDL 53 35 - 70   LDL Cholesterol 67   Hemoglobin A1c     Status: None   Collection Time: 03/17/17 12:00 AM  Result Value Ref Range   Hemoglobin A1C 8.1   TSH     Status: None   Collection Time: 03/17/17 12:00 AM  Result Value Ref Range   TSH 2.44 0.41 - 5.90    Comment: free t4 0.9     Assessment & Plan:   1. Uncontrolled type 2 diabetes mellitus with complication, without long-term current use of insulin ([place])  - Patient has currently uncontrolled symptomatic type 2 DM since  62 years of age. -He came with higher A1c of 8.1% from 6.8%.  He admits to dietary indiscretion which led to significant weight gain since last visit. - No reported gross  complications, however patient remains at a high risk for more acute and chronic complications of diabetes which include CAD, CVA, CKD, retinopathy, and neuropathy. These are all discussed in detail with the patient.  - I have counseled the patient on diet management and weight loss, by adopting a carbohydrate restricted/protein rich diet.  -  Suggestion is made for him to avoid simple carbohydrates  from his diet including Cakes, Sweet Desserts / Pastries, Ice Cream, Soda (diet and regular), Sweet Tea, Candies, Chips, Cookies, Store Bought Juices, Alcohol in Excess of  1-2 drinks a day, Artificial Sweeteners, and "Sugar-free" Products. This will help patient to have stable blood glucose profile and potentially avoid unintended weight gain.   - I encouraged the patient to switch to  unprocessed or minimally processed complex starch and increased protein intake (animal or plant source), fruits, and vegetables.  - Patient is advised to stick to a routine mealtimes to eat 3 meals  a day and avoid unnecessary snacks ( to snack only to correct hypoglycemia).   - I have approached patient with the following individualized plan to manage diabetes and patient agrees:   - Based on his presentation, he may need additional treatment in order for him to achieve control of diabetes target.   -I would continue Metformin 500 mg p.o. twice daily  after meals.   -I discussed and added Tradjenta 5 mg p.o. daily with breakfast.   -He will be considered for basal insulin treatment if he continues to lose control of diabetes during his next visit.   - Patient specific target  A1c;  LDL, HDL, Triglycerides, and  Waist Circumference were discussed in detail.  2) BP/HTN: His blood pressure is not controlled to target.  I advised him to continue his current blood pressure medications including ACE inhibitors . I advised him to resume nifedipine  90 mg by mouth daily along with his lisinopril/hydrochlorothiazide. 3)  Lipids/HPL:  Controlled, LDL 67.  I advised him to continue his atorvastatin 10 mg p.o. nightly.  4)  Weight/Diet: CDE Consult has been , exercise, and detailed carbohydrates information provided.   5) Chronic Care/Health Maintenance:  -Patient is on ACEI/ARB and Statin medications and encouraged to continue to follow up with Ophthalmology, Podiatrist at least yearly or according to recommendations, and advised to   stay away from smoking. I have recommended yearly flu vaccine and pneumonia vaccination at least every 5 years; moderate intensity exercise for up to 150 minutes weekly; and  sleep for at least 7 hours a day.  - Time  spent with the patient: 25 min, of which >50% was spent in reviewing his  current and  previous labs, previous treatments, and medications  doses and developing a plan for long-term care.   - I advised patient to maintain close follow up with Deloria Lair., MD for primary care needs.  Follow up plan: - Return in about 3 months (around 06/19/2017) for follow up with pre-visit labs.  Glade Lloyd, MD Phone: [phone]  Fax: [phone]  -  This note was partially dictated with voice recognition software. Similar sounding words can be transcribed inadequately or may not  be corrected upon review.  03/22/2017, 5:31 PM

## 2017-05-23 ENCOUNTER — Other Ambulatory Visit: Payer: Self-pay

## 2017-05-23 MED ORDER — LINAGLIPTIN 5 MG PO TABS: 5.0000 mg | ORAL_TABLET | Freq: Every day | ORAL | 0 refills | Status: DC

## 2017-06-16 LAB — HEMOGLOBIN A1C: Hemoglobin A1C: 7

## 2017-06-16 LAB — BASIC METABOLIC PANEL
BUN: 19 (ref 4–21)
Creatinine: 1.2 (ref 0.6–1.3)

## 2017-06-23 ENCOUNTER — Ambulatory Visit (INDEPENDENT_AMBULATORY_CARE_PROVIDER_SITE_OTHER): Payer: Managed Care, Other (non HMO) | Admitting: "Endocrinology

## 2017-06-23 ENCOUNTER — Encounter: Payer: Self-pay | Admitting: "Endocrinology

## 2017-06-23 ENCOUNTER — Ambulatory Visit (INDEPENDENT_AMBULATORY_CARE_PROVIDER_SITE_OTHER): Payer: Managed Care, Other (non HMO) | Admitting: Urology

## 2017-06-23 VITALS — BP 114/69 | HR 74 | Ht 70.0 in | Wt 186.0 lb

## 2017-06-23 DIAGNOSIS — R351 Nocturia: Secondary | ICD-10-CM

## 2017-06-23 DIAGNOSIS — E1165 Type 2 diabetes mellitus with hyperglycemia: Secondary | ICD-10-CM | POA: Diagnosis not present

## 2017-06-23 DIAGNOSIS — I10 Essential (primary) hypertension: Secondary | ICD-10-CM

## 2017-06-23 DIAGNOSIS — E118 Type 2 diabetes mellitus with unspecified complications: Secondary | ICD-10-CM

## 2017-06-23 DIAGNOSIS — E782 Mixed hyperlipidemia: Secondary | ICD-10-CM

## 2017-06-23 DIAGNOSIS — IMO0002 Reserved for concepts with insufficient information to code with codable children: Secondary | ICD-10-CM

## 2017-06-23 NOTE — Progress Notes (Signed)
Subjective:    Patient ID: Darryl Mayer, male    DOB: December 15, 1955. Patient is being seen in Follow-up for management of diabetes requested by  Deloria Lair., MD  Past Medical History:  Diagnosis Date  . Diabetes mellitus, type II ([place])   . Hyperlipidemia   . Hypertension    Past Surgical History:  Procedure Laterality Date  . HEMORROIDECTOMY     Social History   Socioeconomic History  . Marital status: Married    Spouse name: Not on file  . Number of children: Not on file  . Years of education: Not on file  . Highest education level: Not on file  Occupational History  . Not on file  Social Needs  . Financial resource strain: Not on file  . Food insecurity:    Worry: Not on file    Inability: Not on file  . Transportation needs:    Medical: Not on file    Non-medical: Not on file  Tobacco Use  . Smoking status: Never Smoker  . Smokeless tobacco: Never Used  Substance and Sexual Activity  . Alcohol use: Not on file  . Drug use: No  . Sexual activity: Not on file  Lifestyle  . Physical activity:    Days per week: Not on file    Minutes per session: Not on file  . Stress: Not on file  Relationships  . Social connections:    Talks on phone: Not on file    Gets together: Not on file    Attends religious service: Not on file    Active member of club or organization: Not on file    Attends meetings of clubs or organizations: Not on file    Relationship status: Not on file  Other Topics Concern  . Not on file  Social History Narrative  . Not on file   Outpatient Encounter Medications as of 06/23/2017  Medication Sig  . aspirin 81 MG tablet Take 81 mg by mouth daily.  Marland Kitchen atorvastatin (LIPITOR) 10 MG tablet Take 10 mg by mouth daily.  Marland Kitchen ibuprofen (ADVIL,MOTRIN) 800 MG tablet 3 (three) times daily.  Marland Kitchen linagliptin (TRADJENTA) 5 MG TABS tablet Take 1 tablet (5 mg total) by mouth daily.  Marland Kitchen lisinopril-hydrochlorothiazide (PRINZIDE,ZESTORETIC) 20-12.5 MG tablet  Take 2 tablets by mouth daily.  . metFORMIN (GLUCOPHAGE) 500 MG tablet Take 1 tablet (500 mg total) by mouth 2 (two) times daily with a meal.  . NIFEdipine (ADALAT CC) 90 MG 24 hr tablet Take 90 mg by mouth daily.  Glory Rosebush VERIO test strip USE AS DIRECTED FOUR TIMES A DAY  . penicillin v potassium (VEETID) 500 MG tablet 3 (three) times daily.  . potassium chloride (K-DUR) 10 MEQ tablet Take 10 mEq by mouth daily.  . tamsulosin (FLOMAX) 0.4 MG CAPS capsule Take 0.8 mg by mouth at bedtime.   No facility-administered encounter medications on file as of 06/23/2017.    ALLERGIES: No Known Allergies VACCINATION STATUS:  There is no immunization history on file for this patient.  Diabetes  He presents for his follow-up diabetic visit. He has type 2 diabetes mellitus. Onset time: He was diagnosed at approximate age of 75 years. His disease course has been improving. There are no hypoglycemic associated symptoms. Pertinent negatives for hypoglycemia include no confusion, headaches, pallor or seizures. Pertinent negatives for diabetes include no chest pain, no fatigue, no polydipsia, no polyphagia, no polyuria and no weakness. There are no hypoglycemic complications. Symptoms are improving.  There are no diabetic complications. Risk factors for coronary artery disease include diabetes mellitus, dyslipidemia, male sex, hypertension and sedentary lifestyle. Current diabetic treatment includes oral agent (dual therapy). His weight is decreasing steadily. He is following a generally unhealthy diet. When asked about meal planning, he reported none. He has not had a previous visit with a dietitian. He never participates in exercise. An ACE inhibitor/angiotensin II receptor blocker is being taken. Eye exam is current.  Hyperlipidemia  This is a chronic problem. The current episode started more than 1 year ago. The problem is controlled. Exacerbating diseases include diabetes. Pertinent negatives include no chest  pain, myalgias or shortness of breath. Current antihyperlipidemic treatment includes statins. Risk factors for coronary artery disease include diabetes mellitus, dyslipidemia, hypertension, male sex and a sedentary lifestyle.  Hypertension  This is a chronic problem. The current episode started more than 1 year ago. The problem is uncontrolled. Pertinent negatives include no chest pain, headaches, neck pain, palpitations or shortness of breath. Risk factors for coronary artery disease include dyslipidemia, diabetes mellitus, male gender and sedentary lifestyle. Past treatments include ACE inhibitors.   Review of Systems  Constitutional: Negative for chills, fatigue, fever and unexpected weight change.  HENT: Negative for dental problem, mouth sores and trouble swallowing.   Eyes: Negative for visual disturbance.  Respiratory: Negative for cough, choking, chest tightness, shortness of breath and wheezing.   Cardiovascular: Negative for chest pain, palpitations and leg swelling.  Gastrointestinal: Negative for abdominal distention, abdominal pain, constipation, diarrhea, nausea and vomiting.  Endocrine: Negative for polydipsia, polyphagia and polyuria.  Genitourinary: Negative for dysuria, flank pain, hematuria and urgency.  Musculoskeletal: Negative for back pain, gait problem, myalgias and neck pain.  Skin: Negative for pallor, rash and wound.  Neurological: Negative for seizures, syncope, weakness, numbness and headaches.  Psychiatric/Behavioral: Negative.  Negative for confusion and dysphoric mood.    Objective:    BP 114/69   Pulse 74   Ht 5\' 10"  (1.778 m)   Wt 186 lb (84.4 kg)   SpO2 100%   BMI 26.69 kg/m   Wt Readings from Last 3 Encounters:  06/23/17 186 lb (84.4 kg)  03/22/17 190 lb (86.2 kg)  12/09/16 166 lb 3.2 oz (75.4 kg)    Physical Exam  Constitutional: He is oriented to person, place, and time. He appears well-developed. He is cooperative. No distress.  HENT:  Head:  Normocephalic and atraumatic.  Eyes: EOM are normal.  Neck: Normal range of motion. Neck supple. No tracheal deviation present. No thyromegaly present.  Cardiovascular: Normal rate, S1 normal and S2 normal. Exam reveals no gallop.  No murmur heard. Pulses:      Dorsalis pedis pulses are 1+ on the right side, and 1+ on the left side.       Posterior tibial pulses are 1+ on the right side, and 1+ on the left side.  Pulmonary/Chest: Effort normal. No respiratory distress. He has no wheezes.  Abdominal: He exhibits no distension. There is no tenderness. There is no guarding and no CVA tenderness.  Musculoskeletal: He exhibits no edema.       Right shoulder: He exhibits no swelling and no deformity.  Neurological: He is alert and oriented to person, place, and time. He has normal strength and normal reflexes. No cranial nerve deficit or sensory deficit. Gait normal.  Skin: Skin is warm and dry. No rash noted. No cyanosis. Nails show no clubbing.  Psychiatric: He has a normal mood and affect. His speech is  normal. Judgment normal. Cognition and memory are normal.   Recent Results (from the past 2160 hour(s))  Basic metabolic panel     Status: None   Collection Time: 06/16/17 12:00 AM  Result Value Ref Range   BUN 19 4 - 21   Creatinine 1.2 0.6 - 1.3  Hemoglobin A1c     Status: None   Collection Time: 06/16/17 12:00 AM  Result Value Ref Range   Hemoglobin A1C 7      Assessment & Plan:   1. Uncontrolled type 2 diabetes mellitus with complication, without long-term current use of insulin ([place])  - Patient came with better A1c of 7%, improving from 8.1%.   -He reports he made significant change in his diet.   - No reported gross complications, however patient remains at a high risk for more acute and chronic complications of diabetes which include CAD, CVA, CKD, retinopathy, and neuropathy. These are all discussed in detail with the patient.  - I have counseled the patient on diet  management and weight loss, by adopting a carbohydrate restricted/protein rich diet.  -  Suggestion is made for him to avoid simple carbohydrates  from his diet including Cakes, Sweet Desserts / Pastries, Ice Cream, Soda (diet and regular), Sweet Tea, Candies, Chips, Cookies, Store Bought Juices, Alcohol in Excess of  1-2 drinks a day, Artificial Sweeteners, and "Sugar-free" Products. This will help patient to have stable blood glucose profile and potentially avoid unintended weight gain.   - I encouraged the patient to switch to  unprocessed or minimally processed complex starch and increased protein intake (animal or plant source), fruits, and vegetables.  - Patient is advised to stick to a routine mealtimes to eat 3 meals  a day and avoid unnecessary snacks ( to snack only to correct hypoglycemia).   - I have approached patient with the following individualized plan to manage diabetes and patient agrees:   - Based on his presentation with improved A1c of 7%, he will not require insulin treatment at this time.  -I advised him to continue metformin 500 mg p.o. twice daily after breakfast and supper, side effects and precautions discussed with him.  -I also advised him to continue Tradjenta 5 mg p.o. daily with breakfast.   - Patient specific target  A1c;  LDL, HDL, Triglycerides, and  Waist Circumference were discussed in detail.  2) BP/HTN: His blood pressure is controlled to target.   I advised him to continue his current blood pressure medications including lisinopril, hydrochlorothiazide, and nifedipine .  3) Lipids/HPL:  Controlled, LDL 67.  I advised him to continue his atorvastatin 10 mg p.o. nightly.  4)  Weight/Diet: CDE Consult has been , exercise, and detailed carbohydrates information provided.   5) Chronic Care/Health Maintenance:  -Patient is on ACEI/ARB and Statin medications and encouraged to continue to follow up with Ophthalmology, Podiatrist at least yearly or according  to recommendations, and advised to   stay away from smoking. I have recommended yearly flu vaccine and pneumonia vaccination at least every 5 years; moderate intensity exercise for up to 150 minutes weekly; and  sleep for at least 7 hours a day.  - I advised patient to maintain close follow up with Deloria Lair., MD for primary care needs.  - Time spent with the patient: 25 min, of which >50% was spent in reviewing his  current and  previous labs, previous treatments, and medications doses and developing a plan for long-term care.  Mateo Flow Wuebker participated  in the discussions, expressed understanding, and voiced agreement with the above plans.  All questions were answered to his satisfaction. he is encouraged to contact clinic should he have any questions or concerns prior to his return visit.  Follow up plan: - Return in about 4 months (around 10/24/2017) for follow up with pre-visit labs.  Glade Lloyd, MD Phone: [phone]  Fax: [phone]  -  This note was partially dictated with voice recognition software. Similar sounding words can be transcribed inadequately or may not  be corrected upon review.  06/23/2017, 9:51 AM

## 2017-06-23 NOTE — Patient Instructions (Signed)

## 2017-08-09 ENCOUNTER — Ambulatory Visit (INDEPENDENT_AMBULATORY_CARE_PROVIDER_SITE_OTHER): Payer: Managed Care, Other (non HMO) | Admitting: Urology

## 2017-08-09 DIAGNOSIS — R351 Nocturia: Secondary | ICD-10-CM | POA: Diagnosis not present

## 2017-08-09 DIAGNOSIS — N401 Enlarged prostate with lower urinary tract symptoms: Secondary | ICD-10-CM

## 2017-09-21 ENCOUNTER — Other Ambulatory Visit: Payer: Self-pay | Admitting: "Endocrinology

## 2017-09-21 ENCOUNTER — Telehealth: Payer: Self-pay | Admitting: "Endocrinology

## 2017-09-21 MED ORDER — SITAGLIPTIN PHOSPHATE 25 MG PO TABS: 25.0000 mg | ORAL_TABLET | Freq: Every day | ORAL | 2 refills | Status: DC

## 2017-09-21 NOTE — Telephone Encounter (Signed)
Darryl Mayer is calling stating that the linagliptin (TRADJENTA) 5 MG TABS tablet he cant afford it is going to cost him $400 out of pocket and he is asking for an alternative, please advise?

## 2017-09-21 NOTE — Telephone Encounter (Signed)
I will send [place] , if that helps.

## 2017-09-28 ENCOUNTER — Other Ambulatory Visit: Payer: Self-pay

## 2017-09-28 MED ORDER — SITAGLIPTIN PHOSPHATE 25 MG PO TABS: 25.0000 mg | ORAL_TABLET | Freq: Every day | ORAL | 0 refills | Status: DC

## 2017-10-10 ENCOUNTER — Encounter: Payer: Self-pay | Admitting: "Endocrinology

## 2017-10-12 ENCOUNTER — Encounter: Payer: Self-pay | Admitting: "Endocrinology

## 2017-10-25 ENCOUNTER — Telehealth: Payer: Self-pay | Admitting: "Endocrinology

## 2017-10-25 NOTE — Telephone Encounter (Signed)
Mr. Darryl Mayer called and stated that he has gotten laid off from his job and wants to know if we can give him lab results over the phone, please advise?

## 2017-10-25 NOTE — Telephone Encounter (Signed)
I do not see recent labs on him. What labs is he asking for?

## 2017-10-27 ENCOUNTER — Ambulatory Visit: Payer: Managed Care, Other (non HMO) | Admitting: "Endocrinology

## 2017-10-27 NOTE — Telephone Encounter (Signed)
I have placed the results on your desk for review

## 2017-11-10 ENCOUNTER — Other Ambulatory Visit: Payer: Self-pay

## 2017-11-10 MED ORDER — METFORMIN HCL 500 MG PO TABS: 500.0000 mg | ORAL_TABLET | Freq: Two times a day (BID) | ORAL | 0 refills | Status: DC

## 2017-11-13 ENCOUNTER — Other Ambulatory Visit: Payer: Self-pay | Admitting: "Endocrinology

## 2017-12-09 ENCOUNTER — Other Ambulatory Visit: Payer: Self-pay | Admitting: "Endocrinology

## 2018-01-12 ENCOUNTER — Telehealth: Payer: Self-pay | Admitting: "Endocrinology

## 2018-01-12 MED ORDER — SITAGLIPTIN PHOSPHATE 25 MG PO TABS: 25.0000 mg | ORAL_TABLET | Freq: Every day | ORAL | 0 refills | Status: DC

## 2018-01-12 NOTE — Telephone Encounter (Signed)
Rx sent 

## 2018-01-12 NOTE — Telephone Encounter (Signed)
Darryl Mayer is requesting a refill on his JANUVIA 25 MG tablet to be sent to [facility] in [place]

## 2018-01-15 ENCOUNTER — Telehealth: Payer: Self-pay

## 2018-01-18 NOTE — Telephone Encounter (Signed)
error 

## 2018-01-19 ENCOUNTER — Other Ambulatory Visit: Payer: Self-pay | Admitting: "Endocrinology

## 2018-01-20 ENCOUNTER — Other Ambulatory Visit: Payer: Self-pay | Admitting: "Endocrinology

## 2018-02-08 ENCOUNTER — Other Ambulatory Visit: Payer: Self-pay | Admitting: "Endocrinology

## 2018-02-19 ENCOUNTER — Telehealth: Payer: Self-pay | Admitting: "Endocrinology

## 2018-02-19 NOTE — Telephone Encounter (Signed)
We need to see him with repeat labs for [place] to give him prescriptions.  If he does not want to come back, he can address this with his PMD.

## 2018-02-19 NOTE — Telephone Encounter (Signed)
Pt made appt

## 2018-02-19 NOTE — Telephone Encounter (Signed)
Pt states that he is in need of a refill. I advised him that Dr Dorris Fetch sent in a refill on 01-12-18. 30 days w/ no refills until he could be seen. The pt is not willing to make an appt at this time. He also states that the medication needs a PA which I advised him we could not do without result labs or office note.

## 2018-02-27 LAB — BASIC METABOLIC PANEL
BUN: 19 (ref 4–21)
Creatinine: 1.3 (ref 0.6–1.3)

## 2018-02-27 LAB — HEMOGLOBIN A1C: Hemoglobin A1C: 7.6

## 2018-02-27 LAB — MICROALBUMIN, URINE: Microalb, Ur: 1.2

## 2018-02-28 ENCOUNTER — Ambulatory Visit (INDEPENDENT_AMBULATORY_CARE_PROVIDER_SITE_OTHER): Payer: PRIVATE HEALTH INSURANCE | Admitting: Urology

## 2018-02-28 DIAGNOSIS — N401 Enlarged prostate with lower urinary tract symptoms: Secondary | ICD-10-CM | POA: Diagnosis not present

## 2018-02-28 DIAGNOSIS — R351 Nocturia: Secondary | ICD-10-CM

## 2018-03-12 ENCOUNTER — Encounter: Payer: Self-pay | Admitting: "Endocrinology

## 2018-03-12 ENCOUNTER — Ambulatory Visit (INDEPENDENT_AMBULATORY_CARE_PROVIDER_SITE_OTHER): Payer: PRIVATE HEALTH INSURANCE | Admitting: "Endocrinology

## 2018-03-12 VITALS — BP 137/80 | HR 67 | Ht 70.0 in | Wt 194.0 lb

## 2018-03-12 DIAGNOSIS — E782 Mixed hyperlipidemia: Secondary | ICD-10-CM | POA: Diagnosis not present

## 2018-03-12 DIAGNOSIS — E118 Type 2 diabetes mellitus with unspecified complications: Secondary | ICD-10-CM

## 2018-03-12 DIAGNOSIS — E1165 Type 2 diabetes mellitus with hyperglycemia: Secondary | ICD-10-CM

## 2018-03-12 DIAGNOSIS — I10 Essential (primary) hypertension: Secondary | ICD-10-CM | POA: Diagnosis not present

## 2018-03-12 DIAGNOSIS — IMO0002 Reserved for concepts with insufficient information to code with codable children: Secondary | ICD-10-CM

## 2018-03-12 MED ORDER — GLIPIZIDE ER 5 MG PO TB24: 5.0000 mg | ORAL_TABLET | Freq: Every day | ORAL | 3 refills | Status: DC

## 2018-03-12 NOTE — Patient Instructions (Signed)

## 2018-03-12 NOTE — Progress Notes (Signed)
Subjective:    Patient ID: Darryl Mayer, male    DOB: 05-May-1955. Patient is being seen in Follow-up for management of diabetes requested by  Deloria Lair., MD  Past Medical History:  Diagnosis Date  . Diabetes mellitus, type II ([place])   . Hyperlipidemia   . Hypertension    Past Surgical History:  Procedure Laterality Date  . HEMORROIDECTOMY     Social History   Socioeconomic History  . Marital status: Married    Spouse name: Not on file  . Number of children: Not on file  . Years of education: Not on file  . Highest education level: Not on file  Occupational History  . Not on file  Social Needs  . Financial resource strain: Not on file  . Food insecurity:    Worry: Not on file    Inability: Not on file  . Transportation needs:    Medical: Not on file    Non-medical: Not on file  Tobacco Use  . Smoking status: Never Smoker  . Smokeless tobacco: Never Used  Substance and Sexual Activity  . Alcohol use: Not on file  . Drug use: No  . Sexual activity: Not on file  Lifestyle  . Physical activity:    Days per week: Not on file    Minutes per session: Not on file  . Stress: Not on file  Relationships  . Social connections:    Talks on phone: Not on file    Gets together: Not on file    Attends religious service: Not on file    Active member of club or organization: Not on file    Attends meetings of clubs or organizations: Not on file    Relationship status: Not on file  Other Topics Concern  . Not on file  Social History Narrative  . Not on file   Outpatient Encounter Medications as of 03/12/2018  Medication Sig  . aspirin 81 MG tablet Take 81 mg by mouth daily.  Marland Kitchen atorvastatin (LIPITOR) 10 MG tablet Take 10 mg by mouth daily.  Marland Kitchen glipiZIDE (GLUCOTROL XL) 5 MG 24 hr tablet Take 1 tablet (5 mg total) by mouth daily with breakfast.  . lisinopril-hydrochlorothiazide (PRINZIDE,ZESTORETIC) 20-12.5 MG tablet Take 2 tablets by mouth daily.  . metFORMIN  (GLUCOPHAGE) 500 MG tablet Take 1 tablet (500 mg total) by mouth 2 (two) times daily with a meal.  . NIFEdipine (ADALAT CC) 90 MG 24 hr tablet Take 90 mg by mouth daily.  Glory Rosebush VERIO test strip USE AS DIRECTED FOUR TIMES A DAY  . potassium chloride (K-DUR) 10 MEQ tablet Take 10 mEq by mouth daily.  . [DISCONTINUED] ibuprofen (ADVIL,MOTRIN) 800 MG tablet 3 (three) times daily.  . [DISCONTINUED] penicillin v potassium (VEETID) 500 MG tablet 3 (three) times daily.  . [DISCONTINUED] sitaGLIPtin (JANUVIA) 25 MG tablet Take 1 tablet (25 mg total) by mouth daily. (Patient not taking: Reported on 03/12/2018)  . [DISCONTINUED] tamsulosin (FLOMAX) 0.4 MG CAPS capsule Take 0.8 mg by mouth at bedtime.   No facility-administered encounter medications on file as of 03/12/2018.    ALLERGIES: No Known Allergies VACCINATION STATUS:  There is no immunization history on file for this patient.  Diabetes  He presents for his follow-up diabetic visit. He has type 2 diabetes mellitus. Onset time: He was diagnosed at approximate age of 83 years. His disease course has been worsening. There are no hypoglycemic associated symptoms. Pertinent negatives for hypoglycemia include no confusion, headaches,  pallor or seizures. Pertinent negatives for diabetes include no chest pain, no fatigue, no polydipsia, no polyphagia, no polyuria and no weakness. There are no hypoglycemic complications. Symptoms are worsening. There are no diabetic complications. Risk factors for coronary artery disease include diabetes mellitus, dyslipidemia, male sex, hypertension and sedentary lifestyle. Current diabetic treatment includes oral agent (dual therapy). His weight is increasing steadily. He is following a generally unhealthy diet. When asked about meal planning, he reported none. He has not had a previous visit with a dietitian. He never participates in exercise. An ACE inhibitor/angiotensin II receptor blocker is being taken. Eye exam is  current.  Hyperlipidemia  This is a chronic problem. The current episode started more than 1 year ago. The problem is controlled. Exacerbating diseases include diabetes. Pertinent negatives include no chest pain, myalgias or shortness of breath. Current antihyperlipidemic treatment includes statins. Risk factors for coronary artery disease include diabetes mellitus, dyslipidemia, hypertension, male sex and a sedentary lifestyle.  Hypertension  This is a chronic problem. The current episode started more than 1 year ago. The problem is uncontrolled. Pertinent negatives include no chest pain, headaches, neck pain, palpitations or shortness of breath. Risk factors for coronary artery disease include dyslipidemia, diabetes mellitus, male gender and sedentary lifestyle. Past treatments include ACE inhibitors.   Review of Systems  Constitutional: Negative for chills, fatigue, fever and unexpected weight change.  HENT: Negative for dental problem, mouth sores and trouble swallowing.   Eyes: Negative for visual disturbance.  Respiratory: Negative for cough, choking, chest tightness, shortness of breath and wheezing.   Cardiovascular: Negative for chest pain, palpitations and leg swelling.  Gastrointestinal: Negative for abdominal distention, abdominal pain, constipation, diarrhea, nausea and vomiting.  Endocrine: Negative for polydipsia, polyphagia and polyuria.  Genitourinary: Negative for dysuria, flank pain, hematuria and urgency.  Musculoskeletal: Negative for back pain, gait problem, myalgias and neck pain.  Skin: Negative for pallor, rash and wound.  Neurological: Negative for seizures, syncope, weakness, numbness and headaches.  Psychiatric/Behavioral: Negative.  Negative for confusion and dysphoric mood.    Objective:    BP 137/80   Pulse 67   Ht 5\' 10"  (1.778 m)   Wt 194 lb (88 kg)   BMI 27.84 kg/m   Wt Readings from Last 3 Encounters:  03/12/18 194 lb (88 kg)  06/23/17 186 lb (84.4  kg)  03/22/17 190 lb (86.2 kg)    Physical Exam  Constitutional: He is oriented to person, place, and time. He appears well-developed. He is cooperative. No distress.  HENT:  Head: Normocephalic and atraumatic.  Eyes: EOM are normal.  Neck: Normal range of motion. Neck supple. No tracheal deviation present. No thyromegaly present.  Cardiovascular: Normal rate, S1 normal and S2 normal. Exam reveals no gallop.  No murmur heard. Pulses:      Dorsalis pedis pulses are 1+ on the right side and 1+ on the left side.       Posterior tibial pulses are 1+ on the right side and 1+ on the left side.  Pulmonary/Chest: Effort normal. No respiratory distress. He has no wheezes.  Abdominal: He exhibits no distension. There is no abdominal tenderness. There is no guarding and no CVA tenderness.  Musculoskeletal:        General: No edema.     Right shoulder: He exhibits no swelling and no deformity.  Neurological: He is alert and oriented to person, place, and time. He has normal strength and normal reflexes. No cranial nerve deficit or sensory deficit. Gait  normal.  Skin: Skin is warm and dry. No rash noted. No cyanosis. Nails show no clubbing.  Psychiatric: He has a normal mood and affect. His speech is normal. Judgment normal. Cognition and memory are normal.   Recent Results (from the past 2160 hour(s))  Microalbumin, urine     Status: None   Collection Time: 02/27/18 12:00 AM  Result Value Ref Range   Microalb, Ur 1.2   Basic metabolic panel     Status: None   Collection Time: 02/27/18 12:00 AM  Result Value Ref Range   BUN 19 4 - 21   Creatinine 1.3 0.6 - 1.3  Hemoglobin A1c     Status: None   Collection Time: 02/27/18 12:00 AM  Result Value Ref Range   Hemoglobin A1C 7.6      Assessment & Plan:   1. Uncontrolled type 2 diabetes mellitus with complication, without long-term current use of insulin [facility])  -He missed his appointment since May 2019.  Patient came with increasing A1c of  7.6%.    - No reported gross complications, however patient remains at a high risk for more acute and chronic complications of diabetes which include CAD, CVA, CKD, retinopathy, and neuropathy. These are all discussed in detail with the patient.  - I have counseled the patient on diet management and weight loss, by adopting a carbohydrate restricted/protein rich diet.  - Patient admits there is a room for improvement in his diet and drink choices. -  Suggestion is made for him to avoid simple carbohydrates  from his diet including Cakes, Sweet Desserts / Pastries, Ice Cream, Soda (diet and regular), Sweet Tea, Candies, Chips, Cookies, Store Bought Juices, Alcohol in Excess of  1-2 drinks a day, Artificial Sweeteners, and "Sugar-free" Products. This will help patient to have stable blood glucose profile and potentially avoid unintended weight gain.  - I encouraged the patient to switch to  unprocessed or minimally processed complex starch and increased protein intake (animal or plant source), fruits, and vegetables.  - Patient is advised to stick to a routine mealtimes to eat 3 meals  a day and avoid unnecessary snacks ( to snack only to correct hypoglycemia).   - I have approached patient with the following individualized plan to manage diabetes and patient agrees:   - Based on his presentation with increasing A1c of 7.6%, he will need additional treatment besides metformin.  He could not afford Tradjenta nor Januvia.   -I discussed and added glipizide 5 mg p.o. daily at breakfast along with metformin 500 mg p.o. twice daily after breakfast and supper.  He is approached to start monitoring blood glucose 2 times a day-daily before breakfast and at bedtime and call clinic if blood glucose remains or dropping below 70 or staying above 300 mg/dL.    - Patient specific target  A1c;  LDL, HDL, Triglycerides, and  Waist Circumference were discussed in detail.  2) BP/HTN: His blood pressure is controlled  to target.  He is advised to continue his current blood pressure medications including lisinopril, hydrochlorothiazide, and nifedipine .  3) Lipids/HPL: His recent lipid panel showed controlled LDL at 67.  He is advised to continue atorvastatin 10 mg p.o. nightly.  4)  Weight/Diet: CDE Consult has been , exercise, and detailed carbohydrates information provided.   5) Chronic Care/Health Maintenance:  -Patient is on ACEI/ARB and Statin medications and encouraged to continue to follow up with Ophthalmology, Podiatrist at least yearly or according to recommendations, and advised to  stay away from smoking. I have recommended yearly flu vaccine and pneumonia vaccination at least every 5 years; moderate intensity exercise for up to 150 minutes weekly; and  sleep for at least 7 hours a day.  - I advised patient to maintain close follow up with Deloria Lair., MD for primary care needs. - Time spent with the patient: 25 min, of which >50% was spent in reviewing his blood glucose logs , discussing his hypoglycemia and hyperglycemia episodes, reviewing his current and  previous labs / studies and medications  doses and developing a plan to avoid hypoglycemia and hyperglycemia. Please refer to Patient Instructions for Blood Glucose Monitoring and Insulin/Medications Dosing Guide"  in media tab for additional information. Darryl Mayer participated in the discussions, expressed understanding, and voiced agreement with the above plans.  All questions were answered to his satisfaction. he is encouraged to contact clinic should he have any questions or concerns prior to his return visit.   Follow up plan: - Return in about 4 months (around 07/11/2018) for Follow up with Pre-visit Labs, Meter, and Logs.  Glade Lloyd, MD Phone: [phone]  Fax: [phone]  -  This note was partially dictated with voice recognition software. Similar sounding words can be transcribed inadequately or may not  be corrected  upon review.  03/12/2018, 1:45 PM

## 2018-06-02 ENCOUNTER — Other Ambulatory Visit: Payer: Self-pay | Admitting: "Endocrinology

## 2018-06-28 LAB — HEMOGLOBIN A1C: Hemoglobin A1C: 7.3

## 2018-07-11 ENCOUNTER — Ambulatory Visit (INDEPENDENT_AMBULATORY_CARE_PROVIDER_SITE_OTHER): Payer: PRIVATE HEALTH INSURANCE | Admitting: "Endocrinology

## 2018-07-11 ENCOUNTER — Encounter: Payer: Self-pay | Admitting: "Endocrinology

## 2018-07-11 ENCOUNTER — Other Ambulatory Visit: Payer: Self-pay

## 2018-07-11 ENCOUNTER — Other Ambulatory Visit: Payer: Self-pay | Admitting: "Endocrinology

## 2018-07-11 DIAGNOSIS — I10 Essential (primary) hypertension: Secondary | ICD-10-CM

## 2018-07-11 DIAGNOSIS — IMO0002 Reserved for concepts with insufficient information to code with codable children: Secondary | ICD-10-CM

## 2018-07-11 DIAGNOSIS — E118 Type 2 diabetes mellitus with unspecified complications: Secondary | ICD-10-CM | POA: Diagnosis not present

## 2018-07-11 DIAGNOSIS — E1165 Type 2 diabetes mellitus with hyperglycemia: Secondary | ICD-10-CM | POA: Diagnosis not present

## 2018-07-11 DIAGNOSIS — E782 Mixed hyperlipidemia: Secondary | ICD-10-CM | POA: Diagnosis not present

## 2018-07-11 MED ORDER — METFORMIN HCL 500 MG PO TABS: 500.0000 mg | ORAL_TABLET | Freq: Two times a day (BID) | ORAL | 1 refills | Status: DC

## 2018-07-11 MED ORDER — GLIPIZIDE ER 5 MG PO TB24: ORAL_TABLET | ORAL | 1 refills | Status: DC

## 2018-07-11 NOTE — Progress Notes (Signed)
07/11/2018                                Endocrinology Telehealth Visit Follow up Note -During COVID -19 Pandemic  I connected with Darryl Mayer on 07/11/2018   by telephone and verified that I am speaking with the correct person using two identifiers. Darryl Mayer, [birth year]. he has verbally consented to this visit. All issues noted in this document were discussed and addressed. The format was not optimal for physical exam.   Subjective:    Patient ID: Darryl Mayer, male    DOB: Jul 13, 1955. Patient is being engaged in telehealth in Follow-up for management of diabetes requested by  Deloria Lair., MD  Past Medical History:  Diagnosis Date  . Diabetes mellitus, type II ([place])   . Hyperlipidemia   . Hypertension    Past Surgical History:  Procedure Laterality Date  . HEMORROIDECTOMY     Social History   Socioeconomic History  . Marital status: Married    Spouse name: Not on file  . Number of children: Not on file  . Years of education: Not on file  . Highest education level: Not on file  Occupational History  . Not on file  Social Needs  . Financial resource strain: Not on file  . Food insecurity:    Worry: Not on file    Inability: Not on file  . Transportation needs:    Medical: Not on file    Non-medical: Not on file  Tobacco Use  . Smoking status: Never Smoker  . Smokeless tobacco: Never Used  Substance and Sexual Activity  . Alcohol use: Not on file  . Drug use: No  . Sexual activity: Not on file  Lifestyle  . Physical activity:    Days per week: Not on file    Minutes per session: Not on file  . Stress: Not on file  Relationships  . Social connections:    Talks on phone: Not on file    Gets together: Not on file    Attends religious service: Not on file    Active member of club or organization: Not on file    Attends meetings of clubs or organizations: Not on file    Relationship status: Not on file  Other Topics Concern  . Not on file  Social  History Narrative  . Not on file   Outpatient Encounter Medications as of 07/11/2018  Medication Sig  . aspirin 81 MG tablet Take 81 mg by mouth daily.  Marland Kitchen atorvastatin (LIPITOR) 10 MG tablet Take 10 mg by mouth daily.  Marland Kitchen glipiZIDE (GLUCOTROL XL) 5 MG 24 hr tablet TAKE 1 TABLET BY MOUTH EVERY DAY WITH BREAKFAST  . lisinopril-hydrochlorothiazide (PRINZIDE,ZESTORETIC) 20-12.5 MG tablet Take 2 tablets by mouth daily.  . metFORMIN (GLUCOPHAGE) 500 MG tablet Take 1 tablet (500 mg total) by mouth 2 (two) times daily with a meal.  . NIFEdipine (ADALAT CC) 90 MG 24 hr tablet Take 90 mg by mouth daily.  Glory Rosebush VERIO test strip USE AS DIRECTED FOUR TIMES A DAY  . potassium chloride (K-DUR) 10 MEQ tablet Take 10 mEq by mouth daily.  . [DISCONTINUED] glipiZIDE (GLUCOTROL XL) 5 MG 24 hr tablet TAKE 1 TABLET BY MOUTH EVERY DAY WITH BREAKFAST  . [DISCONTINUED] metFORMIN (GLUCOPHAGE) 500 MG tablet Take 1 tablet (500 mg total) by mouth 2 (two) times daily with a meal.   No facility-administered encounter  medications on file as of 07/11/2018.    ALLERGIES: No Known Allergies VACCINATION STATUS:  There is no immunization history on file for this patient.  Diabetes  He presents for his follow-up diabetic visit. He has type 2 diabetes mellitus. Onset time: He was diagnosed at approximate age of 51 years. His disease course has been improving. There are no hypoglycemic associated symptoms. Pertinent negatives for hypoglycemia include no confusion, headaches, pallor or seizures. Pertinent negatives for diabetes include no chest pain, no fatigue, no polydipsia, no polyphagia, no polyuria and no weakness. There are no hypoglycemic complications. Symptoms are improving. There are no diabetic complications. Risk factors for coronary artery disease include diabetes mellitus, dyslipidemia, male sex, hypertension and sedentary lifestyle. Current diabetic treatment includes oral agent (dual therapy). His weight is  increasing steadily. He is following a generally unhealthy diet. When asked about meal planning, he reported none. He has not had a previous visit with a dietitian. He never participates in exercise. An ACE inhibitor/angiotensin II receptor blocker is being taken. Eye exam is current.  Hyperlipidemia  This is a chronic problem. The current episode started more than 1 year ago. The problem is controlled. Exacerbating diseases include diabetes. Pertinent negatives include no chest pain, myalgias or shortness of breath. Current antihyperlipidemic treatment includes statins. Risk factors for coronary artery disease include diabetes mellitus, dyslipidemia, hypertension, male sex and a sedentary lifestyle.  Hypertension  This is a chronic problem. The current episode started more than 1 year ago. The problem is uncontrolled. Pertinent negatives include no chest pain, headaches, neck pain, palpitations or shortness of breath. Risk factors for coronary artery disease include dyslipidemia, diabetes mellitus, male gender and sedentary lifestyle. Past treatments include ACE inhibitors.   Review of Systems  Constitutional: Negative for chills, fatigue, fever and unexpected weight change.  HENT: Negative for dental problem, mouth sores and trouble swallowing.   Eyes: Negative for visual disturbance.  Respiratory: Negative for cough, choking, chest tightness, shortness of breath and wheezing.   Cardiovascular: Negative for chest pain, palpitations and leg swelling.  Gastrointestinal: Negative for abdominal distention, abdominal pain, constipation, diarrhea, nausea and vomiting.  Endocrine: Negative for polydipsia, polyphagia and polyuria.  Genitourinary: Negative for dysuria, flank pain, hematuria and urgency.  Musculoskeletal: Negative for back pain, gait problem, myalgias and neck pain.  Skin: Negative for pallor, rash and wound.  Neurological: Negative for seizures, syncope, weakness, numbness and headaches.   Psychiatric/Behavioral: Negative.  Negative for confusion and dysphoric mood.    Objective:    There were no vitals taken for this visit.  Wt Readings from Last 3 Encounters:  03/12/18 194 lb (88 kg)  06/23/17 186 lb (84.4 kg)  03/22/17 190 lb (86.2 kg)    Recent Results (from the past 2160 hour(s))  Hemoglobin A1c     Status: None   Collection Time: 06/28/18 12:00 AM  Result Value Ref Range   Hemoglobin A1C 7.3     Assessment & Plan:   1. Uncontrolled type 2 diabetes mellitus with complication, without long-term current use of insulin [facility])  -He missed his appointment since May 2019.  Patient came with increasing A1c of 7.3%.    - No reported gross complications, however patient remains at a high risk for more acute and chronic complications of diabetes which include CAD, CVA, CKD, retinopathy, and neuropathy. These are all discussed in detail with the patient.  - I have counseled the patient on diet management and weight loss, by adopting a carbohydrate restricted/protein rich diet.  -  Patient admits there is a room for improvement in his diet and drink choices. -  Suggestion is made for him to avoid simple carbohydrates  from his diet including Cakes, Sweet Desserts / Pastries, Ice Cream, Soda (diet and regular), Sweet Tea, Candies, Chips, Cookies, Store Bought Juices, Alcohol in Excess of  1-2 drinks a day, Artificial Sweeteners, and "Sugar-free" Products. This will help patient to have stable blood glucose profile and potentially avoid unintended weight gain.   - I encouraged the patient to switch to  unprocessed or minimally processed complex starch and increased protein intake (animal or plant source), fruits, and vegetables.  - Patient is advised to stick to a routine mealtimes to eat 3 meals  a day and avoid unnecessary snacks ( to snack only to correct hypoglycemia).   - I have approached patient with the following individualized plan to manage diabetes and patient  agrees:   - Based on his presentation with increasing A1c of 7.3%, he will not need insulin treatment for now. He is advised to continue glipizide 5 mg XL p.o. daily at breakfast, metformin 500 mg p.o. need additional treatment besides metformin.  He could not afford Tradjenta nor Januvia.   -I discussed and added glipizide 5 mg p.o. daily at breakfast along with metformin 500 mg p.o. twice daily after breakfast and supper.   - Patient specific target  A1c;  LDL, HDL, Triglycerides, and  Waist Circumference were discussed in detail.  2) BP/HTN: he is advised to home monitor blood pressure and report if > 140/90 on 2 separate readings. Marland Kitchen  He is advised to continue his current blood pressure medications including lisinopril, hydrochlorothiazide, and nifedipine .  3) Lipids/HPL: His recent lipid panel showed controlled LDL at 67.  He is advised to continue atorvastatin 10 mg p.o. nightly.   4)  Weight/Diet: CDE Consult has been , exercise, and detailed carbohydrates information provided.   5) Chronic Care/Health Maintenance:  -Patient is on ACEI/ARB and Statin medications and encouraged to continue to follow up with Ophthalmology, Podiatrist at least yearly or according to recommendations, and advised to   stay away from smoking. I have recommended yearly flu vaccine and pneumonia vaccination at least every 5 years; moderate intensity exercise for up to 150 minutes weekly; and  sleep for at least 7 hours a day.  - I advised patient to maintain close follow up with Deloria Lair., MD for primary care needs.  - Patient Care Time Today:  25 min, of which >50% was spent in reviewing his  current and  previous labs/studies, previous treatments, and medications doses and developing a plan for long-term care based on the latest recommendations for standards of care.  Darryl Mayer participated in the discussions, expressed understanding, and voiced agreement with the above plans.  All questions  were answered to his satisfaction. he is encouraged to contact clinic should he have any questions or concerns prior to his return visit.    Follow up plan: - Return in about 4 months (around 11/10/2018) for Follow up with Pre-visit Labs.  Glade Lloyd, MD Phone: [phone]  Fax: [phone]  -  This note was partially dictated with voice recognition software. Similar sounding words can be transcribed inadequately or may not  be corrected upon review.  07/11/2018, 9:04 AM

## 2018-11-12 ENCOUNTER — Ambulatory Visit: Payer: PRIVATE HEALTH INSURANCE | Admitting: "Endocrinology

## 2018-11-15 LAB — HEPATIC FUNCTION PANEL
ALT: 38 (ref 10–40)
AST: 24 (ref 14–40)
Bilirubin, Total: 0.7

## 2018-11-15 LAB — POCT GLYCOSYLATED HEMOGLOBIN (HGB A1C): Hemoglobin A1C: 7.4 % — AB (ref 4.0–5.6)

## 2018-11-15 LAB — BASIC METABOLIC PANEL
BUN: 21 (ref 4–21)
Creatinine: 1.2 (ref 0.6–1.3)
Glucose: 155

## 2018-11-27 ENCOUNTER — Encounter: Payer: Self-pay | Admitting: "Endocrinology

## 2018-11-27 ENCOUNTER — Other Ambulatory Visit: Payer: Self-pay

## 2018-11-27 ENCOUNTER — Ambulatory Visit (INDEPENDENT_AMBULATORY_CARE_PROVIDER_SITE_OTHER): Payer: Medicare Other | Admitting: "Endocrinology

## 2018-11-27 VITALS — BP 134/80 | HR 72 | Ht 70.0 in | Wt 192.0 lb

## 2018-11-27 DIAGNOSIS — E1165 Type 2 diabetes mellitus with hyperglycemia: Secondary | ICD-10-CM

## 2018-11-27 DIAGNOSIS — E118 Type 2 diabetes mellitus with unspecified complications: Secondary | ICD-10-CM | POA: Diagnosis not present

## 2018-11-27 DIAGNOSIS — IMO0002 Reserved for concepts with insufficient information to code with codable children: Secondary | ICD-10-CM

## 2018-11-27 NOTE — Progress Notes (Signed)
11/27/2018                    Endocrinology follow-up note   Subjective:    Patient ID: Darryl Mayer, male    DOB: [birth year]. Patient is being seen in follow-up  for management of diabetes requested by  Deloria Lair., MD  Past Medical History:  Diagnosis Date  . Diabetes mellitus, type II ([place])   . Hyperlipidemia   . Hypertension    Past Surgical History:  Procedure Laterality Date  . HEMORROIDECTOMY     Social History   Socioeconomic History  . Marital status: Married    Spouse name: Not on file  . Number of children: Not on file  . Years of education: Not on file  . Highest education level: Not on file  Occupational History  . Not on file  Social Needs  . Financial resource strain: Not on file  . Food insecurity    Worry: Not on file    Inability: Not on file  . Transportation needs    Medical: Not on file    Non-medical: Not on file  Tobacco Use  . Smoking status: Never Smoker  . Smokeless tobacco: Never Used  Substance and Sexual Activity  . Alcohol use: Not on file  . Drug use: No  . Sexual activity: Not on file  Lifestyle  . Physical activity    Days per week: Not on file    Minutes per session: Not on file  . Stress: Not on file  Relationships  . Social Herbalist on phone: Not on file    Gets together: Not on file    Attends religious service: Not on file    Active member of club or organization: Not on file    Attends meetings of clubs or organizations: Not on file    Relationship status: Not on file  Other Topics Concern  . Not on file  Social History Narrative  . Not on file   Outpatient Encounter Medications as of 11/27/2018  Medication Sig  . aspirin 81 MG tablet Take 81 mg by mouth daily.  Marland Kitchen atorvastatin (LIPITOR) 10 MG tablet Take 10 mg by mouth daily.  Marland Kitchen glipiZIDE (GLUCOTROL XL) 5 MG 24 hr tablet TAKE 1 TABLET BY MOUTH EVERY DAY WITH BREAKFAST  . lisinopril-hydrochlorothiazide (PRINZIDE,ZESTORETIC) 20-12.5 MG tablet  Take 2 tablets by mouth daily.  . metFORMIN (GLUCOPHAGE) 500 MG tablet Take 1 tablet (500 mg total) by mouth 2 (two) times daily with a meal.  . NIFEdipine (ADALAT CC) 90 MG 24 hr tablet Take 90 mg by mouth daily.  Glory Rosebush VERIO test strip USE AS DIRECTED FOUR TIMES A DAY  . potassium chloride (K-DUR) 10 MEQ tablet Take 10 mEq by mouth daily.   No facility-administered encounter medications on file as of 11/27/2018.    ALLERGIES: No Known Allergies VACCINATION STATUS:  There is no immunization history on file for this patient.  Diabetes He presents for his follow-up diabetic visit. He has type 2 diabetes mellitus. Onset time: He was diagnosed at approximate age of 53 years. His disease course has been stable. There are no hypoglycemic associated symptoms. Pertinent negatives for hypoglycemia include no confusion, headaches, pallor or seizures. Pertinent negatives for diabetes include no chest pain, no fatigue, no polydipsia, no polyphagia, no polyuria and no weakness. There are no hypoglycemic complications. Symptoms are stable. There are no diabetic complications. Risk factors for coronary artery disease include diabetes mellitus,  dyslipidemia, male sex, hypertension and sedentary lifestyle. Current diabetic treatment includes oral agent (dual therapy). His weight is stable. He is following a generally unhealthy diet. When asked about meal planning, he reported none. He has not had a previous visit with a dietitian. He never participates in exercise. An ACE inhibitor/angiotensin II receptor blocker is being taken. Eye exam is current.  Hyperlipidemia This is a chronic problem. The current episode started more than 1 year ago. The problem is controlled. Exacerbating diseases include diabetes. Pertinent negatives include no chest pain, myalgias or shortness of breath. Current antihyperlipidemic treatment includes statins. Risk factors for coronary artery disease include diabetes mellitus,  dyslipidemia, hypertension, male sex and a sedentary lifestyle.  Hypertension This is a chronic problem. The current episode started more than 1 year ago. The problem is uncontrolled. Pertinent negatives include no chest pain, headaches, neck pain, palpitations or shortness of breath. Risk factors for coronary artery disease include dyslipidemia, diabetes mellitus, male gender and sedentary lifestyle. Past treatments include ACE inhibitors.   Review of Systems  Constitutional: Negative for chills, fatigue, fever and unexpected weight change.  HENT: Negative for dental problem, mouth sores and trouble swallowing.   Eyes: Negative for visual disturbance.  Respiratory: Negative for cough, choking, chest tightness, shortness of breath and wheezing.   Cardiovascular: Negative for chest pain, palpitations and leg swelling.  Gastrointestinal: Negative for abdominal distention, abdominal pain, constipation, diarrhea, nausea and vomiting.  Endocrine: Negative for polydipsia, polyphagia and polyuria.  Genitourinary: Negative for dysuria, flank pain, hematuria and urgency.  Musculoskeletal: Negative for back pain, gait problem, myalgias and neck pain.  Skin: Negative for pallor, rash and wound.  Neurological: Negative for seizures, syncope, weakness, numbness and headaches.  Psychiatric/Behavioral: Negative.  Negative for confusion and dysphoric mood.   Review of systems Constitutional: no weight gain/loss, no fatigue, no subjective hyperthermia, no subjective hypothermia Eyes: no blurry vision, no xerophthalmia ENT: no sore throat, no nodules palpated in throat, no dysphagia/odynophagia, no hoarseness Cardiovascular: no Chest Pain, no Shortness of Breath, no palpitations, no leg swelling Respiratory: no cough, no SOB Gastrointestinal: no Nausea/Vomiting/Diarhhea Musculoskeletal: no muscle/joint aches Skin: no rashes Neurological: no tremors, no numbness, no tingling, no dizziness Psychiatric: no  depression, no anxiety   Objective:    BP 134/80   Pulse 72   Ht 5\' 10"  (1.778 m)   Wt 192 lb (87.1 kg)   BMI 27.55 kg/m   Wt Readings from Last 3 Encounters:  11/27/18 192 lb (87.1 kg)  03/12/18 194 lb (88 kg)  06/23/17 186 lb (84.4 kg)      Physical Exam- Limited  Constitutional:  Body mass index is 27.55 kg/m. , not in acute distress, normal state of mind Eyes:  EOMI, no exophthalmos Neck: Supple Respiratory: Adequate breathing efforts Musculoskeletal: no gross deformities, strength intact in all four extremities, no gross restriction of joint movements Skin:  no rashes, no hyperemia Neurological: no tremor with outstretched hands.  Recent Results (from the past 2160 hour(s))  Basic metabolic panel     Status: None   Collection Time: 11/15/18 12:00 AM  Result Value Ref Range   Glucose 155    BUN 21 4 - 21   Creatinine 1.2 0.6 - 1.3  Hepatic function panel     Status: None   Collection Time: 11/15/18 12:00 AM  Result Value Ref Range   ALT 38 10 - 40   AST 24 14 - 40   Bilirubin, Total 0.7   HgB A1c  Status: Abnormal   Collection Time: 11/15/18 10:16 AM  Result Value Ref Range   Hemoglobin A1C 7.4 (A) 4.0 - 5.6 %   HbA1c POC (<> result, manual entry)     HbA1c, POC (prediabetic range)     HbA1c, POC (controlled diabetic range)      Assessment & Plan:   1. Uncontrolled type 2 diabetes mellitus with complication, without long-term current use of insulin ([place])    He returns with stable A1c of 7.4%.  Monitors blood glucose randomly, denies hypoglycemia. - No reported gross complications, however patient remains at a high risk for more acute and chronic complications of diabetes which include CAD, CVA, CKD, retinopathy, and neuropathy. These are all discussed in detail with the patient.  - I have counseled the patient on diet management and weight loss, by adopting a carbohydrate restricted/protein rich diet.  - he  admits there is a room for improvement in  his diet and drink choices. -  Suggestion is made for him to avoid simple carbohydrates  from his diet including Cakes, Sweet Desserts / Pastries, Ice Cream, Soda (diet and regular), Sweet Tea, Candies, Chips, Cookies, Sweet Pastries,  Store Bought Juices, Alcohol in Excess of  1-2 drinks a day, Artificial Sweeteners, Coffee Creamer, and "Sugar-free" Products. This will help patient to have stable blood glucose profile and potentially avoid unintended weight gain.  - I encouraged the patient to switch to  unprocessed or minimally processed complex starch and increased protein intake (animal or plant source), fruits, and vegetables.  - Patient is advised to stick to a routine mealtimes to eat 3 meals  a day and avoid unnecessary snacks ( to snack only to correct hypoglycemia).   - I have approached patient with the following individualized plan to manage diabetes and patient agrees:   -Given his presentation with controlled A1c of 7.4%, he will not need insulin treatment.    -He has benefited from low-dose glipizide therapy.  He is advised to continue glipizide 5 mg XL p.o. daily at breakfast,  metformin 500 mg p.o. twice daily after breakfast and supper.  Advised to monitor blood glucose at least once a day before breakfast.   He is advised to call back if he registers blood glucose readings less than 70 or greater than 200 fasting.   - Patient specific target  A1c;  LDL, HDL, Triglycerides, and  Waist Circumference were discussed in detail.  2) BP/HTN: His blood pressure is controlled to target. Marland Kitchen  He is advised to continue his current blood pressure medications including lisinopril, hydrochlorothiazide, and nifedipine .  3) Lipids/HPL: His recent lipid panel showed controlled LDL at 67.  He is advised to continue atorvastatin 10 mg p.o. nightly.  4)  Weight/Diet: CDE Consult has been , exercise, and detailed carbohydrates information provided.   5) Chronic Care/Health  Maintenance:  -Patient is on ACEI/ARB and Statin medications and encouraged to continue to follow up with Ophthalmology, Podiatrist at least yearly or according to recommendations, and advised to   stay away from smoking. I have recommended yearly flu vaccine and pneumonia vaccination at least every 5 years; moderate intensity exercise for up to 150 minutes weekly; and  sleep for at least 7 hours a day.  - I advised patient to maintain close follow up with Deloria Lair., MD for primary care needs.  - Patient Care Time Today:  25 min, of which >50% was spent in  counseling and the rest reviewing his  current and  previous labs/studies, previous treatments, his blood glucose readings, and medications' doses and developing a plan for long-term care based on the latest recommendations for standards of care.   Levorn Kracht participated in the discussions, expressed understanding, and voiced agreement with the above plans.  All questions were answered to his satisfaction. he is encouraged to contact clinic should he have any questions or concerns prior to his return visit.   Follow up plan: - Return in about 4 months (around 03/30/2019) for Bring Meter and Logs- A1c in Office.  Glade Lloyd, MD Phone: [phone]  Fax: [phone]  -  This note was partially dictated with voice recognition software. Similar sounding words can be transcribed inadequately or may not  be corrected upon review.  11/27/2018, 6:40 PM

## 2018-12-05 ENCOUNTER — Ambulatory Visit (INDEPENDENT_AMBULATORY_CARE_PROVIDER_SITE_OTHER): Payer: Medicare Other | Admitting: Urology

## 2018-12-05 DIAGNOSIS — N401 Enlarged prostate with lower urinary tract symptoms: Secondary | ICD-10-CM

## 2018-12-05 DIAGNOSIS — R351 Nocturia: Secondary | ICD-10-CM | POA: Diagnosis not present

## 2018-12-06 ENCOUNTER — Telehealth: Payer: Self-pay | Admitting: "Endocrinology

## 2018-12-06 NOTE — Telephone Encounter (Signed)
Pt needs refill on glipiZIDE (GLUCOTROL XL) 5 MG 24 hr tablet

## 2018-12-07 ENCOUNTER — Other Ambulatory Visit: Payer: Self-pay | Admitting: "Endocrinology

## 2018-12-07 MED ORDER — GLIPIZIDE ER 5 MG PO TB24: ORAL_TABLET | ORAL | 1 refills | Status: DC

## 2019-01-09 ENCOUNTER — Ambulatory Visit (INDEPENDENT_AMBULATORY_CARE_PROVIDER_SITE_OTHER): Payer: Medicare Other | Admitting: Urology

## 2019-01-09 DIAGNOSIS — N401 Enlarged prostate with lower urinary tract symptoms: Secondary | ICD-10-CM | POA: Diagnosis not present

## 2019-01-09 DIAGNOSIS — R351 Nocturia: Secondary | ICD-10-CM | POA: Diagnosis not present

## 2019-04-03 ENCOUNTER — Ambulatory Visit (INDEPENDENT_AMBULATORY_CARE_PROVIDER_SITE_OTHER): Payer: Medicare Other | Admitting: "Endocrinology

## 2019-04-03 ENCOUNTER — Other Ambulatory Visit: Payer: Self-pay

## 2019-04-03 ENCOUNTER — Encounter: Payer: Self-pay | Admitting: "Endocrinology

## 2019-04-03 VITALS — BP 137/78 | HR 76 | Ht 70.0 in | Wt 194.6 lb

## 2019-04-03 DIAGNOSIS — E782 Mixed hyperlipidemia: Secondary | ICD-10-CM

## 2019-04-03 DIAGNOSIS — E118 Type 2 diabetes mellitus with unspecified complications: Secondary | ICD-10-CM

## 2019-04-03 DIAGNOSIS — IMO0002 Reserved for concepts with insufficient information to code with codable children: Secondary | ICD-10-CM

## 2019-04-03 DIAGNOSIS — E1165 Type 2 diabetes mellitus with hyperglycemia: Secondary | ICD-10-CM | POA: Diagnosis not present

## 2019-04-03 DIAGNOSIS — I10 Essential (primary) hypertension: Secondary | ICD-10-CM | POA: Diagnosis not present

## 2019-04-03 LAB — POCT GLYCOSYLATED HEMOGLOBIN (HGB A1C): Hemoglobin A1C: 7.5 % — AB (ref 4.0–5.6)

## 2019-04-03 MED ORDER — METFORMIN HCL 500 MG PO TABS: 500.0000 mg | ORAL_TABLET | Freq: Two times a day (BID) | ORAL | 1 refills | Status: DC

## 2019-04-03 MED ORDER — GLIPIZIDE ER 5 MG PO TB24: ORAL_TABLET | ORAL | 1 refills | Status: DC

## 2019-04-03 NOTE — Patient Instructions (Signed)

## 2019-04-03 NOTE — Progress Notes (Signed)
04/03/2019                    Endocrinology follow-up note   Subjective:    Patient ID: Darryl Mayer, male    DOB: [birth year]. Patient is being seen in follow-up  for management of diabetes requested by  Deloria Lair., MD  Past Medical History:  Diagnosis Date  . Diabetes mellitus, type II ([place])   . Hyperlipidemia   . Hypertension    Past Surgical History:  Procedure Laterality Date  . HEMORROIDECTOMY     Social History   Socioeconomic History  . Marital status: Married    Spouse name: Not on file  . Number of children: Not on file  . Years of education: Not on file  . Highest education level: Not on file  Occupational History  . Not on file  Tobacco Use  . Smoking status: Never Smoker  . Smokeless tobacco: Never Used  Substance and Sexual Activity  . Alcohol use: Not on file  . Drug use: No  . Sexual activity: Not on file  Other Topics Concern  . Not on file  Social History Narrative  . Not on file   Social Determinants of Health   Financial Resource Strain:   . Difficulty of Paying Living Expenses: Not on file  Food Insecurity:   . Worried About Charity fundraiser in the Last Year: Not on file  . Ran Out of Food in the Last Year: Not on file  Transportation Needs:   . Lack of Transportation (Medical): Not on file  . Lack of Transportation (Non-Medical): Not on file  Physical Activity:   . Days of Exercise per Week: Not on file  . Minutes of Exercise per Session: Not on file  Stress:   . Feeling of Stress : Not on file  Social Connections:   . Frequency of Communication with Friends and Family: Not on file  . Frequency of Social Gatherings with Friends and Family: Not on file  . Attends Religious Services: Not on file  . Active Member of Clubs or Organizations: Not on file  . Attends Archivist Meetings: Not on file  . Marital Status: Not on file   Outpatient Encounter Medications as of 04/03/2019  Medication Sig  . alfuzosin  (UROXATRAL) 10 MG 24 hr tablet Take 10 mg by mouth at bedtime.  Marland Kitchen aspirin 81 MG tablet Take 81 mg by mouth daily.  Marland Kitchen atorvastatin (LIPITOR) 10 MG tablet Take 10 mg by mouth daily.  Marland Kitchen glipiZIDE (GLUCOTROL XL) 5 MG 24 hr tablet TAKE 1 TABLET BY MOUTH EVERY DAY WITH BREAKFAST  . lisinopril-hydrochlorothiazide (PRINZIDE,ZESTORETIC) 20-12.5 MG tablet Take 2 tablets by mouth daily.  . metFORMIN (GLUCOPHAGE) 500 MG tablet Take 1 tablet (500 mg total) by mouth 2 (two) times daily with a meal.  . NIFEdipine (ADALAT CC) 90 MG 24 hr tablet Take 90 mg by mouth daily.  Glory Rosebush VERIO test strip USE AS DIRECTED FOUR TIMES A DAY  . oxybutynin (DITROPAN-XL) 10 MG 24 hr tablet Take 10 mg by mouth daily.  . potassium chloride (K-DUR) 10 MEQ tablet Take 10 mEq by mouth daily.  . [DISCONTINUED] glipiZIDE (GLUCOTROL XL) 5 MG 24 hr tablet TAKE 1 TABLET BY MOUTH EVERY DAY WITH BREAKFAST  . [DISCONTINUED] metFORMIN (GLUCOPHAGE) 500 MG tablet Take 1 tablet (500 mg total) by mouth 2 (two) times daily with a meal.   No facility-administered encounter medications on file as of 04/03/2019.  ALLERGIES: No Known Allergies VACCINATION STATUS:  There is no immunization history on file for this patient.  Diabetes He presents for his follow-up diabetic visit. He has type 2 diabetes mellitus. Onset time: He was diagnosed at approximate age of 20 years. His disease course has been stable. There are no hypoglycemic associated symptoms. Pertinent negatives for hypoglycemia include no confusion, headaches, pallor or seizures. Pertinent negatives for diabetes include no chest pain, no fatigue, no polydipsia, no polyphagia, no polyuria and no weakness. There are no hypoglycemic complications. Symptoms are stable. There are no diabetic complications. Risk factors for coronary artery disease include diabetes mellitus, dyslipidemia, male sex, hypertension and sedentary lifestyle. Current diabetic treatment includes oral agent (dual  therapy). His weight is stable. He is following a generally unhealthy diet. When asked about meal planning, he reported none. He has not had a previous visit with a dietitian. He never participates in exercise. An ACE inhibitor/angiotensin II receptor blocker is being taken. Eye exam is current.  Hyperlipidemia This is a chronic problem. The current episode started more than 1 year ago. The problem is controlled. Exacerbating diseases include diabetes. Pertinent negatives include no chest pain, myalgias or shortness of breath. Current antihyperlipidemic treatment includes statins. Risk factors for coronary artery disease include diabetes mellitus, dyslipidemia, hypertension, male sex and a sedentary lifestyle.  Hypertension This is a chronic problem. The current episode started more than 1 year ago. The problem is uncontrolled. Pertinent negatives include no chest pain, headaches, neck pain, palpitations or shortness of breath. Risk factors for coronary artery disease include dyslipidemia, diabetes mellitus, male gender and sedentary lifestyle. Past treatments include ACE inhibitors.   Review of Systems  Constitutional: Negative for chills, fatigue, fever and unexpected weight change.  HENT: Negative for dental problem, mouth sores and trouble swallowing.   Eyes: Negative for visual disturbance.  Respiratory: Negative for cough, choking, chest tightness, shortness of breath and wheezing.   Cardiovascular: Negative for chest pain, palpitations and leg swelling.  Gastrointestinal: Negative for abdominal distention, abdominal pain, constipation, diarrhea, nausea and vomiting.  Endocrine: Negative for polydipsia, polyphagia and polyuria.  Genitourinary: Negative for dysuria, flank pain, hematuria and urgency.  Musculoskeletal: Negative for back pain, gait problem, myalgias and neck pain.  Skin: Negative for pallor, rash and wound.  Neurological: Negative for seizures, syncope, weakness, numbness and  headaches.  Psychiatric/Behavioral: Negative.  Negative for confusion and dysphoric mood.     Objective:    BP 137/78   Pulse 76   Ht 5\' 10"  (1.778 m)   Wt 194 lb 9.6 oz (88.3 kg)   BMI 27.92 kg/m   Wt Readings from Last 3 Encounters:  04/03/19 194 lb 9.6 oz (88.3 kg)  11/27/18 192 lb (87.1 kg)  03/12/18 194 lb (88 kg)      Physical Exam- Limited  Constitutional:  Body mass index is 27.92 kg/m. , not in acute distress, normal state of mind Eyes:  EOMI, no exophthalmos Neck: Supple Thyroid: No gross goiter Respiratory: Adequate breathing efforts Musculoskeletal: no gross deformities, strength intact in all four extremities, no gross restriction of joint movements Skin:  no rashes, no hyperemia Neurological: no tremor with outstretched hands,    Recent Results (from the past 2160 hour(s))  HgB A1c     Status: Abnormal   Collection Time: 04/03/19  8:32 AM  Result Value Ref Range   Hemoglobin A1C 7.5 (A) 4.0 - 5.6 %   HbA1c POC (<> result, manual entry)     HbA1c, POC (prediabetic  range)     HbA1c, POC (controlled diabetic range)      Assessment & Plan:   1. Uncontrolled type 2 diabetes mellitus with complication, without long-term current use of insulin ([place])    He returns with stable glycemic profile and point-of-care A1c is 7.5%, unchanged from his last visit A1c.  He monitors his blood glucose randomly, denies any hypoglycemia.   - No reported gross complications, however patient remains at a high risk for more acute and chronic complications of diabetes which include CAD, CVA, CKD, retinopathy, and neuropathy. These are all discussed in detail with the patient.  - I have counseled the patient on diet management and weight loss, by adopting a carbohydrate restricted/protein rich diet.  - he  admits there is a room for improvement in his diet and drink choices. -  Suggestion is made for him to avoid simple carbohydrates  from his diet including Cakes, Sweet  Desserts / Pastries, Ice Cream, Soda (diet and regular), Sweet Tea, Candies, Chips, Cookies, Sweet Pastries,  Store Bought Juices, Alcohol in Excess of  1-2 drinks a day, Artificial Sweeteners, Coffee Creamer, and "Sugar-free" Products. This will help patient to have stable blood glucose profile and potentially avoid unintended weight gain.  - I encouraged the patient to switch to  unprocessed or minimally processed complex starch and increased protein intake (animal or plant source), fruits, and vegetables.  - Patient is advised to stick to a routine mealtimes to eat 3 meals  a day and avoid unnecessary snacks ( to snack only to correct hypoglycemia).   - I have approached patient with the following individualized plan to manage diabetes and patient agrees:   -Given his presentation with controlled A1c of 7.5%, he will not need any intervention with insulin at this time.     -He has benefited from low-dose glipizide.  He is advised to continue glipizide 5 mg XL p.o. daily at breakfast, advised to continue metformin 500 mg p.o. twice daily after breakfast and supper.  Advised to monitor blood glucose at least once a day before breakfast.   He is advised to call back if he registers blood glucose readings less than 70 or greater than 200 fasting.   - Patient specific target  A1c;  LDL, HDL, Triglycerides, and  Waist Circumference were discussed in detail.  2) BP/HTN: His blood pressure is controlled to target. Marland Kitchen  He is advised to continue his current blood pressure medications including lisinopril, hydrochlorothiazide, and nifedipine .  3) Lipids/HPL: His recent lipid panel showed controlled LDL at 67.  He is advised to continue atorvastatin 10 mg p.o. nightly.  Side effects and precautions discussed with him.   4)  Weight/Diet: His BMI is 27.9. -He is not a candidate for major weight loss.  CDE Consult has been , exercise, and detailed carbohydrates information provided.   5) Chronic  Care/Health Maintenance:  -Patient is on ACEI/ARB and Statin medications and encouraged to continue to follow up with Ophthalmology, Podiatrist at least yearly or according to recommendations, and advised to   stay away from smoking. I have recommended yearly flu vaccine and pneumonia vaccination at least every 5 years; moderate intensity exercise for up to 150 minutes weekly; and  sleep for at least 7 hours a day.  - I advised patient to maintain close follow up with Deloria Lair., MD for primary care needs.  - Time spent on this patient care encounter:  35 min, of which > 50% was spent in  counseling and the rest reviewing his blood glucose logs , discussing his hypoglycemia and hyperglycemia episodes, reviewing his current and  previous labs / studies  ( including abstraction from other facilities) and medications  doses and developing a  long term treatment plan and documenting his care.   Please refer to Patient Instructions for Blood Glucose Monitoring and Insulin/Medications Dosing Guide"  in media tab for additional information. Please  also refer to " Patient Self Inventory" in the Media  tab for reviewed elements of pertinent patient history.  Nico Ludwig participated in the discussions, expressed understanding, and voiced agreement with the above plans.  All questions were answered to his satisfaction. he is encouraged to contact clinic should he have any questions or concerns prior to his return visit.    Follow up plan: - Return in about 6 months (around 10/01/2019) for Bring Meter and Logs- A1c in Office, Follow up with Pre-visit Labs.  Glade Lloyd, MD Phone: [phone]  Fax: [phone]  -  This note was partially dictated with voice recognition software. Similar sounding words can be transcribed inadequately or may not  be corrected upon review.  04/03/2019, 8:47 AM

## 2019-04-14 ENCOUNTER — Other Ambulatory Visit: Payer: Self-pay | Admitting: Urology

## 2019-06-04 LAB — HM DIABETES EYE EXAM

## 2019-07-10 ENCOUNTER — Other Ambulatory Visit: Payer: Self-pay

## 2019-07-10 ENCOUNTER — Encounter: Payer: Self-pay | Admitting: Urology

## 2019-07-10 ENCOUNTER — Ambulatory Visit (INDEPENDENT_AMBULATORY_CARE_PROVIDER_SITE_OTHER): Payer: Medicare Other | Admitting: Urology

## 2019-07-10 VITALS — BP 131/72 | HR 57 | Temp 97.2°F | Ht 72.0 in | Wt 192.0 lb

## 2019-07-10 DIAGNOSIS — R351 Nocturia: Secondary | ICD-10-CM

## 2019-07-10 DIAGNOSIS — N401 Enlarged prostate with lower urinary tract symptoms: Secondary | ICD-10-CM

## 2019-07-10 DIAGNOSIS — N3281 Overactive bladder: Secondary | ICD-10-CM | POA: Diagnosis not present

## 2019-07-10 LAB — BLADDER SCAN AMB NON-IMAGING: Scan Result: 69.9

## 2019-07-10 MED ORDER — ALFUZOSIN HCL ER 10 MG PO TB24: 10.0000 mg | ORAL_TABLET | Freq: Every day | ORAL | 3 refills | Status: DC

## 2019-07-10 MED ORDER — OXYBUTYNIN CHLORIDE ER 10 MG PO TB24: 10.0000 mg | ORAL_TABLET | Freq: Every day | ORAL | 11 refills | Status: DC

## 2019-07-10 MED ORDER — FINASTERIDE 5 MG PO TABS: 5.0000 mg | ORAL_TABLET | Freq: Every day | ORAL | 3 refills | Status: DC

## 2019-07-10 NOTE — Progress Notes (Signed)
07/10/2019 10:06 AM   Darryl Mayer [birth year] [id number]  Referring provider: Deloria Lair., MD [address],  [place] [address]  Nocturia  HPI: Darryl Mayer is a 64yo here for followup for BPH, nocturia and OAB. He is on uroxatral 10mg  daily, finasteride and ditropan 10mg . Strong stream. Nocturia 1-2x. No issues with incomplete emptying. rare urgency but no urge incontinence. No dysuria or hematuria. He has ED for which he takes cialis 5mg  prn  His records from [facility] are as follows: Darryl Mayer is a 64 year-old male established patient who is here for urinating too frequently at night.  He first noticed the symptom approximately 06/08/2015. He usually gets up at night to urinate 1 time. He does not have nights when he does not get up to urinate at all. He does not have trouble falling back asleep once he has been woken up at night.   He does not usually have swelling in his hands and feet during the day. He does not take a diuretic. He does not have to strain or bear down to start his urinary stream.   06/23/2017: For the past 2 years he has noticed worsening nocturia 2-3x. He was started on flomax by Dr. Exie Parody which did not improve his LUTS. He was previously seen by Dr. Exie Parody and had cystoscopy. He recommended TURP. He has associated urgency and rare urge incontinence. no feeling of incomplete emptying. He does not recall having a PSA.   08/09/2017: He notes no change in his nocturia after starting uroxatral 10mg .   02/28/2018: He noted no improvement in his nocturia since starting finasteride   12/05/2018: Nocturia was not improved with increasing uroxatral to 10mg  BID   01/09/2019: Mirabegron 25mg  improved the nocturia but he could not afford the medication   CC: I have an enlarged prostate (follow-up). HPI: He is currently taking uroxatral, rapaflo, and finasteride. He is not on new medications for symptoms of prostate enlargement.   He does have an abnormal sensation  when needing to urinate. He is not having problems getting his urine stream started. He does not have a good size and strength to his urinary stream. He is not having problems with emptying his bladder well. He does dribble at the end of urination.   08/09/2017: He notes no improvement in his LUTS after switching from flomax to Uroxatral.   02/28/2018: He has been on finasteride and uroxatral since last visit. He notes no improvement in his nocturia, urgency or frequency   12/05/2018: He notes no improvement in the urgency, frequency, nocturia on uroxatral BID and finasteride.   01/09/2019: nocturia decreased to 1-2x. Frequency also decreased on mirabegron 25mg  daily      PMH: Past Medical History:  Diagnosis Date  . Diabetes mellitus, type II ([place])   . Hyperlipidemia   . Hypertension     Surgical History: Past Surgical History:  Procedure Laterality Date  . HEMORROIDECTOMY      Home Medications:  Allergies as of 07/10/2019   No Known Allergies     Medication List       Accurate as of July 10, 2019 10:06 AM. If you have any questions, ask your nurse or doctor.        alfuzosin 10 MG 24 hr tablet Commonly known as: UROXATRAL Take 10 mg by mouth at bedtime.   aspirin 81 MG EC tablet Take by mouth. What changed: Another medication with the same name was removed. Continue taking this medication,  and follow the directions you see here. Changed by: Nicolette Bang, MD   atorvastatin 10 MG tablet Commonly known as: LIPITOR TAKE 1 TABLET BY MOUTH EVERY DAY What changed: Another medication with the same name was removed. Continue taking this medication, and follow the directions you see here. Changed by: Nicolette Bang, MD   finasteride 5 MG tablet Commonly known as: PROSCAR TAKE 1 TABLET BY MOUTH EVERY DAY   glipiZIDE 5 MG 24 hr tablet Commonly known as: GLUCOTROL XL TAKE 1 TABLET BY MOUTH EVERY DAY WITH BREAKFAST   lisinopril-hydrochlorothiazide 20-12.5 MG  tablet Commonly known as: ZESTORETIC Take 2 tablets by mouth daily.   metFORMIN 500 MG tablet Commonly known as: GLUCOPHAGE Take 1 tablet (500 mg total) by mouth 2 (two) times daily with a meal.   NIFEdipine 90 MG 24 hr tablet Commonly known as: PROCARDIA XL/NIFEDICAL-XL TAKE 1 TABLET BY MOUTH EVERY DAY What changed: Another medication with the same name was removed. Continue taking this medication, and follow the directions you see here. Changed by: Nicolette Bang, MD   OneTouch Delica Plus 123456 Misc USE TO CHECK BLOOD SUGAR ONCE DAILY   OneTouch Verio test strip Generic drug: glucose blood USE AS DIRECTED FOUR TIMES A DAY What changed: Another medication with the same name was removed. Continue taking this medication, and follow the directions you see here. Changed by: Nicolette Bang, MD   oxybutynin 10 MG 24 hr tablet Commonly known as: DITROPAN-XL Take 10 mg by mouth daily.   potassium chloride 10 MEQ tablet Commonly known as: KLOR-CON Take by mouth. What changed: Another medication with the same name was removed. Continue taking this medication, and follow the directions you see here. Changed by: Nicolette Bang, MD   tadalafil 5 MG tablet Commonly known as: CIALIS Take 5 mg by mouth daily as needed. What changed: Another medication with the same name was removed. Continue taking this medication, and follow the directions you see here. Changed by: Nicolette Bang, MD       Allergies: No Known Allergies  Family History: Family History  Problem Relation Age of Onset  . Hypertension Father   . CAD Father   . Heart attack Father   . Hypertension Sister   . Diabetes Sister   . Hypertension Brother   . Diabetes Brother     Social History:  reports that he has never smoked. He has never used smokeless tobacco. He reports that he does not use drugs. No history on file for alcohol.  ROS: All other review of systems were reviewed and are negative  except what is noted above in HPI  Physical Exam: BP 131/72   Pulse (!) 57   Temp (!) 97.2 F (36.2 C)   Ht 6' (1.829 m)   Wt 192 lb (87.1 kg)   BMI 26.04 kg/m   Constitutional:  Alert and oriented, No acute distress. HEENT: [place] AT, moist mucus membranes.  Trachea midline, no masses. Cardiovascular: No clubbing, cyanosis, or edema. Respiratory: Normal respiratory effort, no increased work of breathing. GI: Abdomen is soft, nontender, nondistended, no abdominal masses GU: No CVA tenderness. Circumcised phallus. No masses/lesions on penis, testis, scrotum. Prostate 40g smooth no nodules no induration.  Lymph: No cervical or inguinal lymphadenopathy. Skin: No rashes, bruises or suspicious lesions. Neurologic: Grossly intact, no focal deficits, moving all 4 extremities. Psychiatric: Normal mood and affect.  Laboratory Data: No results found for: WBC, HGB, HCT, MCV, PLT  Lab Results  Component Value Date  CREATININE 1.2 11/15/2018    No results found for: PSA  No results found for: TESTOSTERONE  Lab Results  Component Value Date   HGBA1C 7.5 (A) 04/03/2019    Urinalysis No results found for: COLORURINE, APPEARANCEUR, LABSPEC, PHURINE, GLUCOSEU, HGBUR, BILIRUBINUR, KETONESUR, PROTEINUR, UROBILINOGEN, NITRITE, LEUKOCYTESUR  No results found for: LABMICR, Dawn, RBCUA, LABEPIT, MUCUS, BACTERIA  Pertinent Imaging:  No results found for this or any previous visit. No results found for this or any previous visit. No results found for this or any previous visit. No results found for this or any previous visit. No results found for this or any previous visit. No results found for this or any previous visit. No results found for this or any previous visit. No results found for this or any previous visit.  Assessment & Plan:    1. Benign localized prostatic hyperplasia with lower urinary tract symptoms (LUTS) -Continue uroxatral and finasteride  2. Nocturia Continue  uroxatral and finasteride - BLADDER SCAN AMB NON-IMAGING  3. OAB (overactive bladder) -continue ditropan 10mg  daily   No follow-ups on file.  Nicolette Bang, MD  [facility] [place]

## 2019-07-10 NOTE — Progress Notes (Signed)
Urological Symptom Review  Patient is experiencing the following symptoms: Get up at night to urinate Erection problems (male only)   Review of Systems  Gastrointestinal (upper)  : Negative for upper GI symptoms  Gastrointestinal (lower) : Negative for lower GI symptoms  Constitutional : Negative for symptoms  Skin: Negative for skin symptoms  Eyes: Negative for eye symptoms  Ear/Nose/Throat : Negative for ear/nose.throat  Hematologic/Lymphatic: Negative for Hematologic/Lymphatic symptoms  Cardiovascular : Negative for cardiovascular symptoms  Respiratory : Negative for respiratory symptoms  Endocrine: Negative for endocrine symptoms  Musculoskeletal: Negative for musculoskeletal symptoms  Neurological: Negative for neurological symptoms  Psychologic: Negative for psychiatric symptoms

## 2019-07-10 NOTE — Patient Instructions (Signed)

## 2019-09-20 ENCOUNTER — Other Ambulatory Visit: Payer: Self-pay

## 2019-09-20 DIAGNOSIS — IMO0002 Reserved for concepts with insufficient information to code with codable children: Secondary | ICD-10-CM

## 2019-09-20 DIAGNOSIS — E1165 Type 2 diabetes mellitus with hyperglycemia: Secondary | ICD-10-CM

## 2019-09-23 ENCOUNTER — Encounter: Payer: Self-pay | Admitting: "Endocrinology

## 2019-09-23 LAB — HEMOGLOBIN A1C: Hemoglobin A1C: 7.3

## 2019-09-23 LAB — COMPREHENSIVE METABOLIC PANEL
Calcium: 8.3 — AB (ref 8.7–10.7)
GFR calc Af Amer: 54
GFR calc non Af Amer: 47

## 2019-09-23 LAB — BASIC METABOLIC PANEL
BUN: 18 (ref 4–21)
Creatinine: 1.5 — AB (ref 0.6–1.3)

## 2019-10-02 ENCOUNTER — Other Ambulatory Visit: Payer: Self-pay

## 2019-10-02 ENCOUNTER — Encounter: Payer: Self-pay | Admitting: "Endocrinology

## 2019-10-02 ENCOUNTER — Ambulatory Visit (INDEPENDENT_AMBULATORY_CARE_PROVIDER_SITE_OTHER): Payer: Medicare Other | Admitting: "Endocrinology

## 2019-10-02 VITALS — BP 110/78 | HR 64 | Ht 72.0 in | Wt 189.6 lb

## 2019-10-02 DIAGNOSIS — E1165 Type 2 diabetes mellitus with hyperglycemia: Secondary | ICD-10-CM | POA: Diagnosis not present

## 2019-10-02 DIAGNOSIS — E782 Mixed hyperlipidemia: Secondary | ICD-10-CM

## 2019-10-02 DIAGNOSIS — IMO0002 Reserved for concepts with insufficient information to code with codable children: Secondary | ICD-10-CM

## 2019-10-02 DIAGNOSIS — E118 Type 2 diabetes mellitus with unspecified complications: Secondary | ICD-10-CM

## 2019-10-02 DIAGNOSIS — I10 Essential (primary) hypertension: Secondary | ICD-10-CM | POA: Diagnosis not present

## 2019-10-02 NOTE — Progress Notes (Signed)
10/02/2019                    Endocrinology follow-up note   Subjective:    Patient ID: Darryl Mayer, male    DOB: [birth year]. Patient is being seen in follow-up  for management of diabetes requested by  Deloria Lair., MD  Past Medical History:  Diagnosis Date  . Diabetes mellitus, type II ([place])   . Hyperlipidemia   . Hypertension    Past Surgical History:  Procedure Laterality Date  . HEMORROIDECTOMY     Social History   Socioeconomic History  . Marital status: Married    Spouse name: Not on file  . Number of children: Not on file  . Years of education: Not on file  . Highest education level: Not on file  Occupational History  . Not on file  Tobacco Use  . Smoking status: Never Smoker  . Smokeless tobacco: Never Used  Vaping Use  . Vaping Use: Never used  Substance and Sexual Activity  . Alcohol use: Not on file  . Drug use: No  . Sexual activity: Not on file  Other Topics Concern  . Not on file  Social History Narrative  . Not on file   Social Determinants of Health   Financial Resource Strain:   . Difficulty of Paying Living Expenses: Not on file  Food Insecurity:   . Worried About Charity fundraiser in the Last Year: Not on file  . Ran Out of Food in the Last Year: Not on file  Transportation Needs:   . Lack of Transportation (Medical): Not on file  . Lack of Transportation (Non-Medical): Not on file  Physical Activity:   . Days of Exercise per Week: Not on file  . Minutes of Exercise per Session: Not on file  Stress:   . Feeling of Stress : Not on file  Social Connections:   . Frequency of Communication with Friends and Family: Not on file  . Frequency of Social Gatherings with Friends and Family: Not on file  . Attends Religious Services: Not on file  . Active Member of Clubs or Organizations: Not on file  . Attends Archivist Meetings: Not on file  . Marital Status: Not on file   Outpatient Encounter Medications as of 10/02/2019   Medication Sig  . alfuzosin (UROXATRAL) 10 MG 24 hr tablet Take 1 tablet (10 mg total) by mouth at bedtime.  Marland Kitchen aspirin 81 MG EC tablet Take by mouth.  Marland Kitchen atorvastatin (LIPITOR) 10 MG tablet TAKE 1 TABLET BY MOUTH EVERY DAY  . finasteride (PROSCAR) 5 MG tablet Take 1 tablet (5 mg total) by mouth daily.  Marland Kitchen glipiZIDE (GLUCOTROL XL) 5 MG 24 hr tablet TAKE 1 TABLET BY MOUTH EVERY DAY WITH BREAKFAST  . glucose blood (ONETOUCH VERIO) test strip USE AS DIRECTED FOUR TIMES A DAY  . Lancets (ONETOUCH DELICA PLUS [id number]) MISC USE TO CHECK BLOOD SUGAR ONCE DAILY  . lisinopril-hydrochlorothiazide (PRINZIDE,ZESTORETIC) 20-12.5 MG tablet Take 2 tablets by mouth daily.  . metFORMIN (GLUCOPHAGE) 500 MG tablet Take 1 tablet (500 mg total) by mouth 2 (two) times daily with a meal.  . NIFEdipine (PROCARDIA XL/NIFEDICAL-XL) 90 MG 24 hr tablet TAKE 1 TABLET BY MOUTH EVERY DAY  . oxybutynin (DITROPAN-XL) 10 MG 24 hr tablet Take 1 tablet (10 mg total) by mouth daily.  . potassium chloride (KLOR-CON) 10 MEQ tablet Take by mouth.  . tadalafil (CIALIS) 5 MG tablet Take 5  mg by mouth daily as needed.   No facility-administered encounter medications on file as of 10/02/2019.   ALLERGIES: No Known Allergies VACCINATION STATUS:  There is no immunization history on file for this patient.  Diabetes He presents for his follow-up diabetic visit. He has type 2 diabetes mellitus. Onset time: He was diagnosed at approximate age of 52 years. His disease course has been stable. There are no hypoglycemic associated symptoms. Pertinent negatives for hypoglycemia include no confusion, headaches, pallor or seizures. Pertinent negatives for diabetes include no chest pain, no fatigue, no polydipsia, no polyphagia, no polyuria and no weakness. There are no hypoglycemic complications. Symptoms are stable. Diabetic complications include nephropathy. Risk factors for coronary artery disease include diabetes mellitus, dyslipidemia, male  sex, hypertension and sedentary lifestyle. Current diabetic treatment includes oral agent (dual therapy). His weight is fluctuating minimally. He is following a generally unhealthy diet. When asked about meal planning, he reported none. He has not had a previous visit with a dietitian. He never participates in exercise. There is no change in his home blood glucose trend. (He reports his glycemic profile range from 120-180 at  Fasting.  He does not report hypoglycemia.  His previsit labs show A1c of 7.3%.) An ACE inhibitor/angiotensin II receptor blocker is being taken. Eye exam is current.  Hyperlipidemia This is a chronic problem. The current episode started more than 1 year ago. The problem is controlled. Exacerbating diseases include diabetes. Pertinent negatives include no chest pain, myalgias or shortness of breath. Current antihyperlipidemic treatment includes statins. Risk factors for coronary artery disease include diabetes mellitus, dyslipidemia, hypertension, male sex and a sedentary lifestyle.  Hypertension This is a chronic problem. The current episode started more than 1 year ago. The problem is uncontrolled. Pertinent negatives include no chest pain, headaches, neck pain, palpitations or shortness of breath. Risk factors for coronary artery disease include dyslipidemia, diabetes mellitus, male gender and sedentary lifestyle. Past treatments include ACE inhibitors.   Review of Systems  Constitutional: Negative for chills, fatigue, fever and unexpected weight change.  HENT: Negative for dental problem, mouth sores and trouble swallowing.   Eyes: Negative for visual disturbance.  Respiratory: Negative for cough, choking, chest tightness, shortness of breath and wheezing.   Cardiovascular: Negative for chest pain, palpitations and leg swelling.  Gastrointestinal: Negative for abdominal distention, abdominal pain, constipation, diarrhea, nausea and vomiting.  Endocrine: Negative for polydipsia,  polyphagia and polyuria.  Genitourinary: Negative for dysuria, flank pain, hematuria and urgency.  Musculoskeletal: Negative for back pain, gait problem, myalgias and neck pain.  Skin: Negative for pallor, rash and wound.  Neurological: Negative for seizures, syncope, weakness, numbness and headaches.  Psychiatric/Behavioral: Negative.  Negative for confusion and dysphoric mood.     Objective:    BP 110/78   Pulse 64   Ht 6' (1.829 m)   Wt 189 lb 9.6 oz (86 kg)   BMI 25.71 kg/m   Wt Readings from Last 3 Encounters:  10/02/19 189 lb 9.6 oz (86 kg)  07/10/19 192 lb (87.1 kg)  04/03/19 194 lb 9.6 oz (88.3 kg)       Physical Exam- Limited  Constitutional:  Body mass index is 25.71 kg/m. , not in acute distress, normal state of mind Eyes:  EOMI, no exophthalmos Neck: Supple Thyroid: No gross goiter Respiratory: Adequate breathing efforts Musculoskeletal: no gross deformities, strength intact in all four extremities, no gross restriction of joint movements Skin:  no rashes, no hyperemia Neurological: no tremor with outstretched hands  Recent Results (from the past 2160 hour(s))  BLADDER SCAN AMB NON-IMAGING     Status: None   Collection Time: 07/10/19  9:50 AM  Result Value Ref Range   Scan Result 17.5   Basic metabolic panel     Status: Abnormal   Collection Time: 09/23/19 12:00 AM  Result Value Ref Range   BUN 18 4 - 21   Creatinine 1.5 (A) 0.6 - 1.3  Comprehensive metabolic panel     Status: Abnormal   Collection Time: 09/23/19 12:00 AM  Result Value Ref Range   GFR calc Af Amer 54    GFR calc non Af Amer 47    Calcium 8.3 (A) 8.7 - 10.7  Hemoglobin A1c     Status: None   Collection Time: 09/23/19 12:00 AM  Result Value Ref Range   Hemoglobin A1C 7.3     Assessment & Plan:   1. Uncontrolled type 2 diabetes mellitus with complication, without long-term current use of insulin ([place])    He returns with stable glycemic profile and previsit labs showing A1c  of 7.3%.    He monitors his blood glucose randomly, denies any hypoglycemia.   - No reported gross complications, however patient remains at a high risk for more acute and chronic complications of diabetes which include CAD, CVA, CKD, retinopathy, and neuropathy. These are all discussed in detail with the patient.  - I have counseled the patient on diet management and weight loss, by adopting a carbohydrate restricted/protein rich diet.  - he  admits there is a room for improvement in his diet and drink choices. -  Suggestion is made for him to avoid simple carbohydrates  from his diet including Cakes, Sweet Desserts / Pastries, Ice Cream, Soda (diet and regular), Sweet Tea, Candies, Chips, Cookies, Sweet Pastries,  Store Bought Juices, Alcohol in Excess of  1-2 drinks a day, Artificial Sweeteners, Coffee Creamer, and "Sugar-free" Products. This will help patient to have stable blood glucose profile and potentially avoid unintended weight gain.   - I encouraged the patient to switch to  unprocessed or minimally processed complex starch and increased protein intake (animal or plant source), fruits, and vegetables.  - Patient is advised to stick to a routine mealtimes to eat 3 meals  a day and avoid unnecessary snacks ( to snack only to correct hypoglycemia).   - I have approached patient with the following individualized plan to manage diabetes and patient agrees:   -Given his presentation with controlled A1c of 7.3%, he will not need any intervention with insulin at this time.     -He has benefited from low-dose glipizide.  He is advised to continue glipizide 5 mg XL p.o. daily at breakfast along with low-dose Metformin 500 mg p.o. twice daily.  His recent labs show CKD, advised on hydration.  She would not tolerate maximum dose of Metformin.   -He is willing and advised to monitor blood glucose at least once a day before breakfast and at any other time as needed.   He is advised to call back if  he registers blood glucose readings less than 70 or greater than 200 fasting.   - Patient specific target  A1c;  LDL, HDL, Triglycerides, and  Waist Circumference were discussed in detail.  2) BP/HTN: His blood pressure is controlled to target. Marland Kitchen  He is advised to continue his current blood pressure medications including lisinopril, hydrochlorothiazide, and nifedipine .  3) Lipids/HPL: His recent lipid panel showed controlled LDL at  67.  He is advised to continue atorvastatin 10 mg p.o. nightly.   Side effects and precautions discussed with him.   4)  Weight/Diet: His BMI is 25.71. -He is not a candidate for major weight loss.  CDE Consult has been , exercise, and detailed carbohydrates information provided.  5) hypocalcemia-1st notice.  His corrected calcium is 8.8 mg per DL.  His Albumin is low at 3.4.  He is advised on increasing protein intake, will not need calcium supplement at this time.  He will need repeat measurements of calcium during his next visit.   6) Chronic Care/Health Maintenance:  -Patient is on ACEI/ARB and Statin medications and encouraged to continue to follow up with Ophthalmology, Podiatrist at least yearly or according to recommendations, and advised to   stay away from smoking. I have recommended yearly flu vaccine and pneumonia vaccination at least every 5 years; moderate intensity exercise for up to 150 minutes weekly; and  sleep for at least 7 hours a day.  - I advised patient to maintain close follow up with Deloria Lair., MD for primary care needs.  - Time spent on this patient care encounter:  35 min, of which > 50% was spent in  counseling and the rest reviewing his blood glucose logs , discussing his hypoglycemia and hyperglycemia episodes, reviewing his current and  previous labs / studies  ( including abstraction from other facilities) and medications  doses and developing a  long term treatment plan and documenting his care.   Please refer to Patient  Instructions for Blood Glucose Monitoring and Insulin/Medications Dosing Guide"  in media tab for additional information. Please  also refer to " Patient Self Inventory" in the Media  tab for reviewed elements of pertinent patient history.  Darryl Mayer participated in the discussions, expressed understanding, and voiced agreement with the above plans.  All questions were answered to his satisfaction. he is encouraged to contact clinic should he have any questions or concerns prior to his return visit.   Follow up plan: - Return in about 4 months (around 02/01/2020) for F/U with Pre-visit Labs, Meter, Logs, A1c here., [place] with [place].  Glade Lloyd, MD Phone: [phone]  Fax: [phone]  -  This note was partially dictated with voice recognition software. Similar sounding words can be transcribed inadequately or may not  be corrected upon review.  10/02/2019, 9:23 AM

## 2019-10-02 NOTE — Patient Instructions (Signed)

## 2019-12-25 ENCOUNTER — Other Ambulatory Visit: Payer: Self-pay | Admitting: "Endocrinology

## 2020-01-23 LAB — LIPID PANEL
Cholesterol: 137 (ref 0–200)
HDL: 49 (ref 35–70)
LDL Cholesterol: 71
Triglycerides: 84 (ref 40–160)

## 2020-01-23 LAB — COMPREHENSIVE METABOLIC PANEL
Calcium: 8.7 (ref 8.7–10.7)
GFR calc Af Amer: 57
GFR calc non Af Amer: 49

## 2020-01-23 LAB — TSH: TSH: 2.15 (ref 0.41–5.90)

## 2020-01-23 LAB — BASIC METABOLIC PANEL
BUN: 22 — AB (ref 4–21)
Creatinine: 1.5 — AB (ref 0.6–1.3)

## 2020-02-03 ENCOUNTER — Telehealth (INDEPENDENT_AMBULATORY_CARE_PROVIDER_SITE_OTHER): Payer: Medicare Other | Admitting: Nurse Practitioner

## 2020-02-03 ENCOUNTER — Encounter: Payer: Self-pay | Admitting: Nurse Practitioner

## 2020-02-03 ENCOUNTER — Other Ambulatory Visit: Payer: Self-pay

## 2020-02-03 DIAGNOSIS — I10 Essential (primary) hypertension: Secondary | ICD-10-CM

## 2020-02-03 DIAGNOSIS — E782 Mixed hyperlipidemia: Secondary | ICD-10-CM | POA: Diagnosis not present

## 2020-02-03 DIAGNOSIS — IMO0002 Reserved for concepts with insufficient information to code with codable children: Secondary | ICD-10-CM

## 2020-02-03 DIAGNOSIS — E7800 Pure hypercholesterolemia, unspecified: Secondary | ICD-10-CM | POA: Insufficient documentation

## 2020-02-03 DIAGNOSIS — G4700 Insomnia, unspecified: Secondary | ICD-10-CM | POA: Insufficient documentation

## 2020-02-03 DIAGNOSIS — E118 Type 2 diabetes mellitus with unspecified complications: Secondary | ICD-10-CM | POA: Diagnosis not present

## 2020-02-03 DIAGNOSIS — N138 Other obstructive and reflux uropathy: Secondary | ICD-10-CM | POA: Insufficient documentation

## 2020-02-03 DIAGNOSIS — N529 Male erectile dysfunction, unspecified: Secondary | ICD-10-CM | POA: Insufficient documentation

## 2020-02-03 DIAGNOSIS — E1165 Type 2 diabetes mellitus with hyperglycemia: Secondary | ICD-10-CM

## 2020-02-03 MED ORDER — ONETOUCH VERIO VI STRP: ORAL_STRIP | 3 refills | Status: DC

## 2020-02-03 MED ORDER — ONETOUCH DELICA PLUS LANCET30G MISC: 3 refills | Status: DC

## 2020-02-03 NOTE — Progress Notes (Signed)
02/03/2020                    Endocrinology follow-up note   TELEHEALTH VISIT: The patient is being engaged in telehealth visit due to COVID-19.  This type of visit limits physical examination significantly, and thus is not preferable over face-to-face encounters.  I connected with  Darryl Mayer on 02/03/20 by a video enabled telemedicine application and verified that I am speaking with the correct person using two identifiers.   I discussed the limitations of evaluation and management by telemedicine. The patient expressed understanding and agreed to proceed.    The participants involved in this visit include: Brita Romp, NP located at [facility] and Mateo Flow Ciresi  located at their personal residence listed.   Subjective:    Patient ID: Darryl Mayer, male    DOB: [birth year]. Patient is being seen in follow-up  for management of diabetes requested by  Deloria Lair., MD  Past Medical History:  Diagnosis Date   Diabetes mellitus, type II ([place])    Hyperlipidemia    Hypertension    Past Surgical History:  Procedure Laterality Date   HEMORROIDECTOMY     Social History   Socioeconomic History   Marital status: Married    Spouse name: Not on file   Number of children: Not on file   Years of education: Not on file   Highest education level: Not on file  Occupational History   Not on file  Tobacco Use   Smoking status: Never Smoker   Smokeless tobacco: Never Used  Vaping Use   Vaping Use: Never used  Substance and Sexual Activity   Alcohol use: Not on file   Drug use: No   Sexual activity: Not on file  Other Topics Concern   Not on file  Social History Narrative   Not on file   Social Determinants of Health   Financial Resource Strain: Not on file  Food Insecurity: Not on file  Transportation Needs: Not on file  Physical Activity: Not on file  Stress: Not on file  Social Connections: Not on file    Outpatient Encounter Medications as of 02/03/2020  Medication Sig   alfuzosin (UROXATRAL) 10 MG 24 hr tablet Take 1 tablet (10 mg total) by mouth at bedtime.   aspirin 81 MG EC tablet Take by mouth.   atorvastatin (LIPITOR) 10 MG tablet TAKE 1 TABLET BY MOUTH EVERY DAY   finasteride (PROSCAR) 5 MG tablet Take 1 tablet (5 mg total) by mouth daily.   glipiZIDE (GLUCOTROL XL) 5 MG 24 hr tablet TAKE 1 TABLET BY MOUTH EVERY DAY WITH BREAKFAST   lisinopril-hydrochlorothiazide (PRINZIDE,ZESTORETIC) 20-12.5 MG tablet Take 2 tablets by mouth daily.   metFORMIN (GLUCOPHAGE) 500 MG tablet TAKE 1 TABLET BY MOUTH 2 TIMES DAILY WITH A MEAL.   NIFEdipine (PROCARDIA XL/NIFEDICAL-XL) 90 MG 24 hr tablet TAKE 1 TABLET BY MOUTH EVERY DAY   oxybutynin (DITROPAN-XL) 10 MG 24 hr tablet Take 1 tablet (10 mg total) by mouth daily.   potassium chloride (KLOR-CON) 10 MEQ tablet Take by mouth.   tadalafil (CIALIS) 5 MG tablet Take 5 mg by mouth daily as needed.   [DISCONTINUED] glucose blood (ONETOUCH VERIO) test strip USE AS DIRECTED FOUR TIMES A DAY   [DISCONTINUED] Lancets (ONETOUCH DELICA PLUS Q000111Q) MISC USE TO CHECK BLOOD SUGAR ONCE DAILY   glucose blood (ONETOUCH VERIO) test strip USE AS DIRECTED to monitor glucose twice daily.   Lancets [facility]  PLUS LANCET30G) MISC USE TO CHECK BLOOD SUGAR ONCE DAILY   No facility-administered encounter medications on file as of 02/03/2020.   ALLERGIES: No Known Allergies VACCINATION STATUS: Immunization History  Administered Date(s) Administered   PFIZER SARS-COV-2 Vaccination 11/25/2019    Diabetes He presents for his follow-up diabetic visit. He has type 2 diabetes mellitus. Onset time: He was diagnosed at approximate age of 75 years. His disease course has been stable. There are no hypoglycemic associated symptoms. Pertinent negatives for hypoglycemia include no confusion, headaches, pallor or seizures. Pertinent negatives for diabetes  include no chest pain, no fatigue, no polydipsia, no polyphagia, no polyuria and no weakness. There are no hypoglycemic complications. Symptoms are stable. Diabetic complications include nephropathy. Risk factors for coronary artery disease include diabetes mellitus, dyslipidemia, male sex, hypertension and sedentary lifestyle. Current diabetic treatment includes oral agent (dual therapy). He is compliant with treatment all of the time. His weight is fluctuating minimally. He is following a generally unhealthy diet. When asked about meal planning, he reported none. He has not had a previous visit with a dietitian. He never participates in exercise. His home blood glucose trend is fluctuating minimally. His breakfast blood glucose range is generally 130-140 mg/dl. (He presents today for his virtual visit with his meter and logs to review.  He monitors his blood glucose once daily before breakfast with averages between 130-150.  His A1c was not checked prior to today's visit.  He denies any hypoglycemia.) An ACE inhibitor/angiotensin II receptor blocker is being taken. He does not see a podiatrist.Eye exam is current.  Hyperlipidemia This is a chronic problem. The current episode started more than 1 year ago. The problem is controlled. Recent lipid tests were reviewed and are normal. Exacerbating diseases include chronic renal disease and diabetes. Factors aggravating his hyperlipidemia include thiazides. Pertinent negatives include no chest pain, myalgias or shortness of breath. Current antihyperlipidemic treatment includes statins. The current treatment provides moderate improvement of lipids. There are no compliance problems.  Risk factors for coronary artery disease include diabetes mellitus, dyslipidemia, hypertension, male sex and a sedentary lifestyle.  Hypertension This is a chronic problem. The current episode started more than 1 year ago. The problem has been gradually improving since onset. The problem  is controlled. Pertinent negatives include no chest pain, headaches, neck pain, palpitations or shortness of breath. There are no associated agents to hypertension. Risk factors for coronary artery disease include dyslipidemia, diabetes mellitus, male gender and sedentary lifestyle. Past treatments include ACE inhibitors and diuretics. The current treatment provides moderate improvement. There are no compliance problems.  Hypertensive end-organ damage includes kidney disease. Identifiable causes of hypertension include chronic renal disease.   Review of systems  Constitutional: + Minimally fluctuating body weight,  current There is no height or weight on file to calculate BMI. , no fatigue, no subjective hyperthermia, no subjective hypothermia Eyes: no blurry vision, no xerophthalmia ENT: no sore throat, no nodules palpated in throat, no dysphagia/odynophagia, no hoarseness Cardiovascular: no chest pain, no shortness of breath, no palpitations, no leg swelling Respiratory: no cough, no shortness of breath Gastrointestinal: no nausea/vomiting/diarrhea Musculoskeletal: no muscle/joint aches Skin: no rashes, no hyperemia Neurological: no tremors, no numbness, no tingling, no dizziness Psychiatric: no depression, no anxiety   Objective:    There were no vitals taken for this visit.  Wt Readings from Last 3 Encounters:  10/02/19 189 lb 9.6 oz (86 kg)  07/10/19 192 lb (87.1 kg)  04/03/19 194 lb 9.6 oz (88.3 kg)  BP Readings from Last 3 Encounters:  10/02/19 110/78  07/10/19 131/72  04/03/19 137/78     Physical Exam- Telehealth- significantly limited due to nature of visit  Constitutional: There is no height or weight on file to calculate BMI. , not in acute distress, normal state of mind Respiratory: Adequate breathing efforts    Recent Results (from the past 2160 hour(s))  Basic metabolic panel     Status: Abnormal   Collection Time: 01/23/20 12:00 AM  Result Value Ref Range    BUN 22 (A) 4 - 21   Creatinine 1.5 (A) 0.6 - 1.3  Comprehensive metabolic panel     Status: None   Collection Time: 01/23/20 12:00 AM  Result Value Ref Range   GFR calc Af Amer 57    GFR calc non Af Amer 49    Calcium 8.7 8.7 - 10.7  Lipid panel     Status: None   Collection Time: 01/23/20 12:00 AM  Result Value Ref Range   Triglycerides 84 40 - 160   Cholesterol 137 0 - 200   HDL 49 35 - 70   LDL Cholesterol 71   TSH     Status: None   Collection Time: 01/23/20 12:00 AM  Result Value Ref Range   TSH 2.15 0.41 - 5.90    Comment: FREE T4- 0.95    Assessment & Plan:   1) Uncontrolled type 2 diabetes mellitus with complication, without long-term current use of insulin ([place])  He presents today for his virtual visit with his meter and logs to review.  He monitors his blood glucose once daily before breakfast with averages between 130-150.  His A1c was not checked prior to today's visit.  He denies any hypoglycemia.  - No reported gross complications, however patient remains at a high risk for more acute and chronic complications of diabetes which include CAD, CVA, CKD, retinopathy, and neuropathy. These are all discussed in detail with the patient.  - Nutritional counseling repeated at each appointment due to patients tendency to fall back in to old habits.  - The patient admits there is a room for improvement in their diet and drink choices. -  Suggestion is made for the patient to avoid simple carbohydrates from their diet including Cakes, Sweet Desserts / Pastries, Ice Cream, Soda (diet and regular), Sweet Tea, Candies, Chips, Cookies, Sweet Pastries,  Store Bought Juices, Alcohol in Excess of  1-2 drinks a day, Artificial Sweeteners, Coffee Creamer, and "Sugar-free" Products. This will help patient to have stable blood glucose profile and potentially avoid unintended weight gain.   - I encouraged the patient to switch to  unprocessed or minimally processed complex starch and  increased protein intake (animal or plant source), fruits, and vegetables.   - Patient is advised to stick to a routine mealtimes to eat 3 meals  a day and avoid unnecessary snacks ( to snack only to correct hypoglycemia).  - I have approached patient with the following individualized plan to manage diabetes and patient agrees:   -Given his stable glycemic profile, he is advised to continue Metformin 500 mg po twice daily with meals and continue Glipizide 5 mg XL daily with breakfast.  His recent labs show improved CKD, advised to maintain adequate hydration.  She would not tolerate maximum dose of Metformin.    -He is encouraged to continue monitoring blood glucose at least once daily, before breakfast and to call the clinic if his readings are less than 70 or greater  than 200 for 3 tests in a row.  - Patient specific target  A1c;  LDL, HDL, Triglycerides, and  Waist Circumference were discussed in detail.  2) BP/HTN:  His blood pressure is controlled to target per previous visit readings.  He does not monitor BP at home.  He is advised to continue Lisinopril-HCT 20-12.5 mg po daily.   3) Lipids/HPL:  His most recent lipid panel from 01/23/20 shows controlled LDL at 71.  He is advised to continue Lipitor 10 mg po daily at bedtime.  Side effects and precautions discussed with him.  4)  Weight/Diet:  His There is no height or weight on file to calculate BMI.-- not a candidate for major weight loss.  CDE Consult has been , exercise, and detailed carbohydrates information provided.  5) Hypocalcemia-1st notice on 09/23/19.   His most recent Calcium level is WNL at 8.7.  He will need repeat measurements of calcium on subsequent visits.  6) Chronic Care/Health Maintenance: -Patient is on ACEI/ARB and Statin medications and encouraged to continue to follow up with Ophthalmology, Podiatrist at least yearly or according to recommendations, and advised to   stay away from smoking. I have recommended  yearly flu vaccine and pneumonia vaccination at least every 5 years; moderate intensity exercise for up to 150 minutes weekly; and  sleep for at least 7 hours a day.  - I advised patient to maintain close follow up with Louie Boston., MD for primary care needs.   I spent 20 minutes dedicated to the care of this patient on the date of this encounter to include pre-visit review of records, face-to-face time with the patient, and post visit ordering of  testing.   Please refer to Patient Instructions for Blood Glucose Monitoring and Insulin/Medications Dosing Guide"  in media tab for additional information. Please  also refer to " Patient Self Inventory" in the Media  tab for reviewed elements of pertinent patient history.  Ej Cohick participated in the discussions, expressed understanding, and voiced agreement with the above plans.  All questions were answered to his satisfaction. he is encouraged to contact clinic should he have any questions or concerns prior to his return visit.   Follow up plan: - Return in about 4 months (around 06/03/2020) for Diabetes follow up- A1c and urine micro in office, ABI next visit, No previsit labs.  Ronny Bacon, [employer] [facility] [address] [place], [place] [address] Phone: [phone] Fax: [phone]  02/03/2020, 8:52 AM

## 2020-02-03 NOTE — Patient Instructions (Signed)

## 2020-02-04 ENCOUNTER — Other Ambulatory Visit: Payer: Self-pay | Admitting: "Endocrinology

## 2020-02-04 ENCOUNTER — Telehealth: Payer: Self-pay

## 2020-02-04 NOTE — Telephone Encounter (Signed)
Please send readings Dr Wyline Mood in [place] phone # [phone].  That is his PCP

## 2020-02-04 NOTE — Telephone Encounter (Signed)
Faxed pt's visit note from yesterday.

## 2020-02-11 ENCOUNTER — Other Ambulatory Visit: Payer: Self-pay

## 2020-02-11 ENCOUNTER — Telehealth: Payer: Self-pay

## 2020-02-11 DIAGNOSIS — E1165 Type 2 diabetes mellitus with hyperglycemia: Secondary | ICD-10-CM

## 2020-02-11 DIAGNOSIS — IMO0002 Reserved for concepts with insufficient information to code with codable children: Secondary | ICD-10-CM

## 2020-02-11 MED ORDER — ONETOUCH VERIO W/DEVICE KIT: PACK | 0 refills | Status: AC

## 2020-02-11 NOTE — Telephone Encounter (Signed)
done

## 2020-02-11 NOTE — Telephone Encounter (Signed)
Pt needs a new meter called in. One Touch Verio. His other broke. Please send in supplies with it as well. [facility], [place]

## 2020-03-24 ENCOUNTER — Emergency Department (HOSPITAL_COMMUNITY): Payer: Medicare HMO

## 2020-03-24 ENCOUNTER — Other Ambulatory Visit: Payer: Self-pay

## 2020-03-24 ENCOUNTER — Emergency Department (HOSPITAL_COMMUNITY)
Admission: EM | Admit: 2020-03-24 | Discharge: 2020-03-24 | Disposition: A | Discharge status: Home / Self Care | Payer: Medicare HMO | Attending: Emergency Medicine | Admitting: Emergency Medicine

## 2020-03-24 ENCOUNTER — Encounter (HOSPITAL_COMMUNITY): Payer: Self-pay | Admitting: *Deleted

## 2020-03-24 DIAGNOSIS — Z7982 Long term (current) use of aspirin: Secondary | ICD-10-CM | POA: Diagnosis not present

## 2020-03-24 DIAGNOSIS — Z7984 Long term (current) use of oral hypoglycemic drugs: Secondary | ICD-10-CM | POA: Insufficient documentation

## 2020-03-24 DIAGNOSIS — E876 Hypokalemia: Secondary | ICD-10-CM | POA: Diagnosis not present

## 2020-03-24 DIAGNOSIS — I10 Essential (primary) hypertension: Secondary | ICD-10-CM | POA: Insufficient documentation

## 2020-03-24 DIAGNOSIS — Z79899 Other long term (current) drug therapy: Secondary | ICD-10-CM | POA: Diagnosis not present

## 2020-03-24 DIAGNOSIS — R1033 Periumbilical pain: Secondary | ICD-10-CM | POA: Diagnosis not present

## 2020-03-24 DIAGNOSIS — E119 Type 2 diabetes mellitus without complications: Secondary | ICD-10-CM | POA: Diagnosis not present

## 2020-03-24 LAB — COMPREHENSIVE METABOLIC PANEL
ALT: 21 U/L (ref 0–44)
AST: 23 U/L (ref 15–41)
Albumin: 3.6 g/dL (ref 3.5–5.0)
Alkaline Phosphatase: 40 U/L (ref 38–126)
Anion gap: 6 (ref 5–15)
BUN: 15 mg/dL (ref 8–23)
CO2: 26 mmol/L (ref 22–32)
Calcium: 8.9 mg/dL (ref 8.9–10.3)
Chloride: 105 mmol/L (ref 98–111)
Creatinine, Ser: 1.48 mg/dL — ABNORMAL HIGH (ref 0.61–1.24)
GFR, Estimated: 52 mL/min — ABNORMAL LOW (ref >60)
Glucose, Bld: 102 mg/dL — ABNORMAL HIGH (ref 70–99)
Potassium: 3.2 mmol/L — ABNORMAL LOW (ref 3.5–5.1)
Sodium: 137 mmol/L (ref 135–145)
Total Bilirubin: 0.7 mg/dL (ref 0.3–1.2)
Total Protein: 6.7 g/dL (ref 6.5–8.1)

## 2020-03-24 LAB — CBC
HCT: 37.6 % — ABNORMAL LOW (ref 39.0–52.0)
Hemoglobin: 12.4 g/dL — ABNORMAL LOW (ref 13.0–17.0)
MCH: 30.3 pg (ref 26.0–34.0)
MCHC: 33 g/dL (ref 30.0–36.0)
MCV: 91.9 fL (ref 80.0–100.0)
Platelets: 251 10*3/uL (ref 150–400)
RBC: 4.09 MIL/uL — ABNORMAL LOW (ref 4.22–5.81)
RDW: 12.9 % (ref 11.5–15.5)
WBC: 7.3 10*3/uL (ref 4.0–10.5)
nRBC: 0 % (ref 0.0–0.2)

## 2020-03-24 LAB — DIFFERENTIAL
Abs Immature Granulocytes: 0.01 10*3/uL (ref 0.00–0.07)
Basophils Absolute: 0.1 10*3/uL (ref 0.0–0.1)
Basophils Relative: 1 %
Eosinophils Absolute: 0.2 10*3/uL (ref 0.0–0.5)
Eosinophils Relative: 2 %
Immature Granulocytes: 0 %
Lymphocytes Relative: 20 %
Lymphs Abs: 1.5 10*3/uL (ref 0.7–4.0)
Monocytes Absolute: 0.7 10*3/uL (ref 0.1–1.0)
Monocytes Relative: 9 %
Neutro Abs: 5 10*3/uL (ref 1.7–7.7)
Neutrophils Relative %: 68 %

## 2020-03-24 LAB — URINALYSIS, ROUTINE W REFLEX MICROSCOPIC
Bilirubin Urine: NEGATIVE
Glucose, UA: NEGATIVE mg/dL
Hgb urine dipstick: NEGATIVE
Ketones, ur: NEGATIVE mg/dL
Leukocytes,Ua: NEGATIVE
Nitrite: NEGATIVE
Protein, ur: NEGATIVE mg/dL
Specific Gravity, Urine: 1.012 (ref 1.005–1.030)
pH: 6 (ref 5.0–8.0)

## 2020-03-24 LAB — LIPASE, BLOOD: Lipase: 32 U/L (ref 11–51)

## 2020-03-24 LAB — TROPONIN I (HIGH SENSITIVITY): Troponin I (High Sensitivity): 3 ng/L (ref <18)

## 2020-03-24 NOTE — ED Notes (Signed)
Entered room and introduced self to patient. Pt appears to be resting in bed, respirations are even and unlabored with equal chest rise and fall. Bed is locked in the lowest position, side rails x2, call bell within reach. Pt educated on call light use and hourly rounding, verbalized understanding and in agreement at this time. All questions and concerns voiced addressed. Refreshments offered and provided per patient request.  

## 2020-03-24 NOTE — ED Triage Notes (Signed)
C/o abdominal pain for 3 weeks, states he gets a [place] short of breath when he exerts himself for a few days

## 2020-03-24 NOTE — Discharge Instructions (Addendum)
Your CT scan and lab tests today are reassuring with no obvious source for your abdominal pain found.  With this intermittent pain with lifting,  it is possible that scar tissue has formed at your surgery site causing this pain, a condition called "adhesions".  You do not have a hernia.  Also, your blood tests, chest xray and ekg are reassuring. Your potassium is low today at 3.2 (normal range is 3.5 - 5). Please increase your potassium supplement taking one additional tablet in the evening for the next week.    Call Dr. Huel Cote for a recheck of your symptoms.  Avoid heavy lifting to help avoid your pain symptom.

## 2020-03-24 NOTE — ED Notes (Signed)
ED Provider at bedside. 

## 2020-03-24 NOTE — ED Provider Notes (Addendum)
[facility] EMERGENCY DEPARTMENT Provider Note   CSN: [id number] Arrival date & time: 03/24/20  1115     History Chief Complaint  Patient presents with  . Abdominal Pain    Darryl Mayer is a 65 y.o. 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 He denies any reported abdominal mass or swelling during these episodes, also no n/v/d, no fevers and is currently sx free.  He also describes having an episode of lightheadedness, weakness and sob while doing chores around his home 4 days ago.  He felt dehydrated so sat down and drank fluids, felt better after about 10 minutes.  Denies CP, palpitations, no diaphoresis n/v with this episode.  No sx since this event 4 days ago.  The history is provided by the patient.       Past Medical History:  Diagnosis Date  . Diabetes mellitus, type II ([place])   . Hyperlipidemia   . Hypertension     Patient Active Problem List   Diagnosis Date Noted  . Erectile dysfunction 02/03/2020  . Insomnia 02/03/2020  . 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 Pure hypercholesterolemia 02/03/2020  . Benign localized prostatic hyperplasia with lower urinary tract symptoms (LUTS) 07/10/2019  . Nocturia 07/10/2019  . OAB (overactive bladder) 07/10/2019  . Frequent headaches 12/09/2016  . Benign prostatic hyperplasia with urinary frequency 11/11/2016  . 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 Type 2 diabetes mellitus without complication, without long-term current use of insulin ([place]) 06/01/2015  . Mixed hyperlipidemia 06/01/2015  . Essential hypertension 06/01/2015    Past Surgical History:  Procedure Laterality Date  . HEMORROIDECTOMY         Family History  Problem Relation Age of Onset   . Hypertension Father   . CAD Father   . Heart attack Father   . Hypertension Sister   . Diabetes Sister   . Hypertension Brother   . Diabetes Brother     Social History   Tobacco Use  . Smoking status: Never Smoker  . Smokeless tobacco: Never Used  Vaping Use  . Vaping Use: Never used  Substance Use Topics  . Drug use: No    Home Medications Prior to Admission medications   Medication Sig Start Date End Date Taking? Authorizing Provider  alfuzosin (UROXATRAL) 10 MG 24 hr tablet Take 1 tablet (10 mg total) by mouth at bedtime. 07/10/19  Yes McKenzie, Candee Furbish, MD  aspirin 81 MG EC tablet Take by mouth.   Yes [provider]  atorvastatin (LIPITOR) 10 MG tablet Take 10 mg by mouth daily. 07/17/18  Yes [provider]  Blood Glucose Monitoring Suppl (ONETOUCH VERIO) w/Device KIT Use as instructed to test blood glucose twice daily 02/11/20  Yes Nida, Marella Chimes, MD  finasteride (PROSCAR) 5 MG tablet Take 1 tablet (5 mg total) by mouth daily. 07/10/19  Yes McKenzie, Candee Furbish, MD  glipiZIDE (GLUCOTROL XL) 5 MG 24 hr tablet TAKE 1 TABLET BY MOUTH EVERY DAY WITH BREAKFAST 02/04/20  Yes Reardon, Loree Fee J, NP  glucose blood (ONETOUCH VERIO) test strip USE AS DIRECTED to monitor glucose twice daily. 02/03/20  Yes Brita Romp, NP  Lancets (ONETOUCH DELICA PLUS [id number]) [place] USE TO CHECK BLOOD SUGAR ONCE DAILY 02/03/20  Yes Brita Romp,  NP  lisinopril-hydrochlorothiazide (PRINZIDE,ZESTORETIC) 20-12.5 MG tablet Take 2 tablets by mouth daily.   Yes [provider]  metFORMIN (GLUCOPHAGE) 500 MG tablet TAKE 1 TABLET BY MOUTH 2 TIMES DAILY WITH A MEAL. 12/25/19  Yes Nida, Marella Chimes, MD  NIFEdipine (PROCARDIA XL/NIFEDICAL-XL) 90 MG 24 hr tablet TAKE 1 TABLET BY MOUTH EVERY DAY 03/19/19  Yes [provider]  oxybutynin (DITROPAN-XL) 10 MG 24 hr tablet Take 1 tablet (10 mg total) by mouth daily. 07/10/19  Yes McKenzie, Candee Furbish, MD  potassium  chloride (KLOR-CON) 10 MEQ tablet Take by mouth. 04/28/17  Yes [provider]  tadalafil (CIALIS) 5 MG tablet Take 5 mg by mouth daily as needed. 05/23/19  Yes [provider]    Allergies    Patient has no known allergies.  Review of Systems   Review of Systems  Constitutional: Negative for chills and fever.  HENT: Negative for congestion.   Eyes: Negative.   Respiratory: Positive for shortness of breath. Negative for cough and chest tightness.   Cardiovascular: Negative for chest pain and palpitations.  Gastrointestinal: Positive for abdominal pain. Negative for diarrhea, nausea and vomiting.  Genitourinary: Negative.   Musculoskeletal: Negative for arthralgias, joint swelling and neck pain.  Skin: Negative.  Negative for rash and wound.  Neurological: Negative for dizziness, weakness, light-headedness, numbness and headaches.  Psychiatric/Behavioral: Negative.   All other systems reviewed and are negative.   Physical Exam Updated Vital Signs BP 123/81 (BP Location: Right Arm)   Pulse 66   Temp 97.7 F (36.5 C) (Oral)   Resp 18   Ht $R'5\' 11"'YM$  (1.803 m)   Wt 86.2 kg   SpO2 99%   BMI 26.50 kg/m   Physical Exam Vitals and nursing note reviewed.  Constitutional:      Appearance: He is well-developed and well-nourished.  HENT:     Head: Normocephalic and atraumatic.  Eyes:     Conjunctiva/sclera: Conjunctivae normal.  Cardiovascular:     Rate and Rhythm: Normal rate and regular rhythm.     Pulses: Intact distal pulses.     Heart sounds: Normal heart sounds.  Pulmonary:     Effort: Pulmonary effort is normal.     Breath sounds: Normal breath sounds. No wheezing.  Abdominal:     General: Bowel sounds are normal. There is no distension.     Palpations: Abdomen is soft.     Tenderness: There is no abdominal tenderness.     Comments: No ttp abd, no guarding or rebound.  Well healed umbilical surgical incision.   Musculoskeletal:        General: Normal  range of motion.     Cervical back: Normal range of motion.  Skin:    General: Skin is warm and dry.  Neurological:     Mental Status: He is alert.  Psychiatric:        Mood and Affect: Mood and affect normal.     ED Results / Procedures / Treatments   Labs (all labs ordered are listed, but only abnormal results are displayed) Labs Reviewed  COMPREHENSIVE METABOLIC PANEL - Abnormal; Notable for the following components:      Result Value   Potassium 3.2 (*)    Glucose, Bld 102 (*)    Creatinine, Ser 1.48 (*)    GFR, Estimated 52 (*)    All other components within normal limits  CBC - Abnormal; Notable for the following components:   RBC 4.09 (*)    Hemoglobin 12.4 (*)  HCT 37.6 (*)    All other components within normal limits  LIPASE, BLOOD  URINALYSIS, ROUTINE W REFLEX MICROSCOPIC  DIFFERENTIAL  TROPONIN I (HIGH SENSITIVITY)      EKG EKG Interpretation  Date/Time:  Tuesday March 24 2020 11:20:11 EST Ventricular Rate:  89 PR Interval:  150 QRS Duration: 72 QT Interval:  354 QTC Calculation: 430 R Axis:   63 Text Interpretation: Sinus rhythm with Premature atrial complexes Otherwise normal ECG No old tracing to compare Confirmed by Calvert Cantor [phone]) on 03/24/2020 11:33:12 AM   Radiology CT ABDOMEN PELVIS WO CONTRAST  Result Date: 03/24/2020 CLINICAL DATA:  Abdominal pain, suspected hernia with centralized abdominal pain for 3 weeks, no nausea, vomiting or diarrhea. EXAM: CT ABDOMEN AND PELVIS WITHOUT CONTRAST TECHNIQUE: Multidetector CT imaging of the abdomen and pelvis was performed following the standard protocol without IV contrast. COMPARISON:  July 21, 2016. FINDINGS: Lower chest: Lung bases are clear. No effusion. No consolidative changes. Hepatobiliary: Smooth hepatic contours. No pericholecystic stranding or fluid. Pancreas: Pancreas without signs of gross ductal dilation or contour abnormality. Question of mild peripancreatic stranding, no  peripancreatic fluid. Spleen: Normal Adrenals/Urinary Tract: Adrenal glands are normal. Mild bilateral perinephric stranding. No hydronephrosis. Urinary bladder with smooth contours. No nephrolithiasis. Low-density renal lesions bilaterally large cyst along the anterior margin of the LEFT kidney measures 4.0 cm with homogeneous water density. Cystic area in the upper pole likely renal sinus cyst on the RIGHT and another area measuring 1.4 cm in the RIGHT upper pole with a slightly larger cyst arising from the lower pole the RIGHT kidney. Stomach/Bowel: Stomach is under distended limiting assessment. No acute small bowel process. Post RIGHT hemicolectomy with ileocolonic anastomosis. Low rectal anastomosis. Stool fills much of the colon. No pericolonic stranding. Vascular/Lymphatic: Aortic atherosclerosis. No aneurysmal dilation. There is no gastrohepatic or hepatoduodenal ligament lymphadenopathy. No retroperitoneal or mesenteric lymphadenopathy. Reproductive: Prostate unremarkable grossly by CT. No pelvic sidewall lymphadenopathy. Other: No ascites.  No free air. Musculoskeletal: No acute musculoskeletal process. No abdominal wall hernia. IMPRESSION: 1. Question of mild peripancreatic stranding, no peripancreatic fluid. Correlate with pancreatic enzymes. 2. Mild perinephric stranding as well is nonspecific. Correlation with urinalysis may also be helpful. 3. Postoperative changes of RIGHT hemicolectomy in rectal resection with low rectal anastomosis. 4. Renal cysts. 5. Aortic atherosclerosis. Aortic Atherosclerosis (ICD10-I70.0). Electronically Signed   By: Zetta Bills M.D.   On: 03/24/2020 15:19   DG Chest Portable 1 View  Result Date: 03/24/2020 CLINICAL DATA:  Chest pain, shortness of breath. EXAM: PORTABLE CHEST 1 VIEW COMPARISON:  None. FINDINGS: The heart size and mediastinal contours are within normal limits. Both lungs are clear. No pneumothorax or pleural effusion is noted. The visualized skeletal  structures are unremarkable. IMPRESSION: No active disease. Electronically Signed   By: Marijo Conception M.D.   On: 03/24/2020 13:59    Procedures Procedures   Medications Ordered in ED Medications - No data to display  ED Course  I have reviewed the triage vital signs and the nursing notes.  Pertinent labs & imaging results that were available during my care of the patient were reviewed by me and considered in my medical decision making (see chart for details).    MDM Rules/Calculators/A&P                         Labs and imaging reviewed and discussed with pt. Ct findings noted, sx not suggestive of acute pancreatitis, normal lipase level.  No urinary sx. Pt also denies constipation, last bm this am, no straining.  Orthostatic VS normal Pt with intermittent periumbilical pain triggered by lifting heavy objects.  CT negative for hernia or other obvious source of pain.  Possible adhesions, no bowel obstruction - discussed this with pt.  Advised recheck by pcp and/or consider f/u with his general surgeon (Dr Ladona Horns in [place]).  He does have mild hypokalemia, takes K+ 10 meq qd, advised increase to bid x 1 week.  Final Clinical Impression(s) / ED Diagnoses Final diagnoses:  Periumbilical abdominal pain  Hypokalemia    Rx / DC Orders ED Discharge Orders    None       Landis Martins 03/24/20 1644    Evalee Jefferson, PA-C 03/24/20 1651    Truddie Hidden, MD 03/25/20 0700

## 2020-06-03 ENCOUNTER — Ambulatory Visit: Payer: Medicare HMO | Admitting: Nurse Practitioner

## 2020-06-03 ENCOUNTER — Encounter: Payer: Self-pay | Admitting: Nurse Practitioner

## 2020-06-03 ENCOUNTER — Other Ambulatory Visit: Payer: Self-pay

## 2020-06-03 VITALS — BP 135/85 | HR 66 | Ht 72.0 in | Wt 192.0 lb

## 2020-06-03 DIAGNOSIS — E118 Type 2 diabetes mellitus with unspecified complications: Secondary | ICD-10-CM

## 2020-06-03 DIAGNOSIS — E1165 Type 2 diabetes mellitus with hyperglycemia: Secondary | ICD-10-CM | POA: Diagnosis not present

## 2020-06-03 DIAGNOSIS — E782 Mixed hyperlipidemia: Secondary | ICD-10-CM | POA: Diagnosis not present

## 2020-06-03 DIAGNOSIS — IMO0002 Reserved for concepts with insufficient information to code with codable children: Secondary | ICD-10-CM

## 2020-06-03 DIAGNOSIS — I10 Essential (primary) hypertension: Secondary | ICD-10-CM

## 2020-06-03 LAB — POCT GLYCOSYLATED HEMOGLOBIN (HGB A1C): HbA1c, POC (controlled diabetic range): 7.3 % — AB (ref 0.0–7.0)

## 2020-06-03 LAB — POCT UA - MICROALBUMIN: Microalbumin Ur, POC: 10 mg/L

## 2020-06-03 MED ORDER — METFORMIN HCL 500 MG PO TABS: 500.0000 mg | ORAL_TABLET | Freq: Every day | ORAL | 1 refills | Status: DC

## 2020-06-03 MED ORDER — ONETOUCH DELICA PLUS LANCET30G MISC: 3 refills | Status: DC

## 2020-06-03 NOTE — Progress Notes (Signed)
06/03/2020                    Endocrinology follow-up note    Subjective:    Patient ID: Darryl Mayer, male    DOB: 09/21/1955. Patient is being seen in follow-up  for management of diabetes requested by  Leeanne Rio, MD  Past Medical History:  Diagnosis Date  . Diabetes mellitus, type II ([place])   . Hyperlipidemia   . Hypertension    Past Surgical History:  Procedure Laterality Date  . HEMORROIDECTOMY     Social History   Socioeconomic History  . Marital status: Married    Spouse name: Not on file  . Number of children: Not on file  . Years of education: Not on file  . Highest education level: Not on file  Occupational History  . Not on file  Tobacco Use  . Smoking status: Never Smoker  . Smokeless tobacco: Never Used  Vaping Use  . Vaping Use: Never used  Substance and Sexual Activity  . Alcohol use: Not on file  . Drug use: No  . Sexual activity: Not on file  Other Topics Concern  . Not on file  Social History Narrative  . Not on file   Social Determinants of Health   Financial Resource Strain: Not on file  Food Insecurity: Not on file  Transportation Needs: Not on file  Physical Activity: Not on file  Stress: Not on file  Social Connections: Not on file   Outpatient Encounter Medications as of 06/03/2020  Medication Sig  . alfuzosin (UROXATRAL) 10 MG 24 hr tablet Take 1 tablet (10 mg total) by mouth at bedtime.  Marland Kitchen aspirin 81 MG EC tablet Take by mouth.  Marland Kitchen atorvastatin (LIPITOR) 10 MG tablet Take 10 mg by mouth daily.  . Blood Glucose Monitoring Suppl (ONETOUCH VERIO) w/Device KIT Use as instructed to test blood glucose twice daily  . finasteride (PROSCAR) 5 MG tablet Take 1 tablet (5 mg total) by mouth daily.  Marland Kitchen glipiZIDE (GLUCOTROL XL) 5 MG 24 hr tablet TAKE 1 TABLET BY MOUTH EVERY DAY WITH BREAKFAST  . glucose blood (ONETOUCH VERIO) test strip USE AS DIRECTED to monitor glucose twice daily.  . Lancets (ONETOUCH DELICA PLUS [id number]) MISC USE  TO CHECK BLOOD SUGAR ONCE DAILY  . lisinopril-hydrochlorothiazide (PRINZIDE,ZESTORETIC) 20-12.5 MG tablet Take 2 tablets by mouth daily.  . metFORMIN (GLUCOPHAGE) 500 MG tablet TAKE 1 TABLET BY MOUTH 2 TIMES DAILY WITH A MEAL.  Marland Kitchen NIFEdipine (PROCARDIA XL/NIFEDICAL-XL) 90 MG 24 hr tablet TAKE 1 TABLET BY MOUTH EVERY DAY  . oxybutynin (DITROPAN-XL) 10 MG 24 hr tablet Take 1 tablet (10 mg total) by mouth daily.  . potassium chloride (KLOR-CON) 10 MEQ tablet Take by mouth.  . tadalafil (CIALIS) 5 MG tablet Take 5 mg by mouth daily as needed.   No facility-administered encounter medications on file as of 06/03/2020.   ALLERGIES: No Known Allergies VACCINATION STATUS: Immunization History  Administered Date(s) Administered  . PFIZER(Purple Top)SARS-COV-2 Vaccination 11/25/2019    Diabetes He presents for his follow-up diabetic visit. He has type 2 diabetes mellitus. Onset time: He was diagnosed at approximate age of 4 years. His disease course has been stable. There are no hypoglycemic associated symptoms. Pertinent negatives for hypoglycemia include no confusion, headaches, pallor or seizures. Pertinent negatives for diabetes include no chest pain, no fatigue, no polydipsia, no polyphagia, no polyuria and no weakness. There are no hypoglycemic complications. Symptoms are stable. Diabetic complications include  impotence and nephropathy. Risk factors for coronary artery disease include diabetes mellitus, dyslipidemia, male sex, hypertension and sedentary lifestyle. Current diabetic treatment includes oral agent (dual therapy). He is compliant with treatment all of the time. His weight is increasing steadily. He is following a generally unhealthy diet. When asked about meal planning, he reported none. He has not had a previous visit with a dietitian. He never participates in exercise. His home blood glucose trend is fluctuating minimally. His breakfast blood glucose range is generally 110-130 mg/dl. His  bedtime blood glucose range is generally 90-110 mg/dl. (He presents today with his meter and logs showing stable glycemic profile with some hypoglycemia before bed.  He reports he frequently snacks to avoid hypoglycemia.  His POCT A1c today is 7.3%, unchanged from previous visit.  ) An ACE inhibitor/angiotensin II receptor blocker is being taken. He does not see a podiatrist.Eye exam is current.  Hyperlipidemia This is a chronic problem. The current episode started more than 1 year ago. The problem is controlled. Recent lipid tests were reviewed and are normal. Exacerbating diseases include chronic renal disease and diabetes. Factors aggravating his hyperlipidemia include thiazides. Pertinent negatives include no chest pain, myalgias or shortness of breath. Current antihyperlipidemic treatment includes statins. The current treatment provides moderate improvement of lipids. There are no compliance problems.  Risk factors for coronary artery disease include diabetes mellitus, dyslipidemia, hypertension, male sex and a sedentary lifestyle.  Hypertension This is a chronic problem. The current episode started more than 1 year ago. The problem has been gradually improving since onset. The problem is controlled. Pertinent negatives include no chest pain, headaches, neck pain, palpitations or shortness of breath. There are no associated agents to hypertension. Risk factors for coronary artery disease include dyslipidemia, diabetes mellitus, male gender and sedentary lifestyle. Past treatments include diuretics and ACE inhibitors. The current treatment provides moderate improvement. There are no compliance problems.  Hypertensive end-organ damage includes kidney disease. Identifiable causes of hypertension include chronic renal disease.     Review of systems  Constitutional: + Minimally fluctuating body weight,  current Body mass index is 26.04 kg/m. , no fatigue, no subjective hyperthermia, no subjective  hypothermia Eyes: no blurry vision, no xerophthalmia ENT: no sore throat, no nodules palpated in throat, no dysphagia/odynophagia, no hoarseness Cardiovascular: no chest pain, no shortness of breath, no palpitations, no leg swelling Respiratory: no cough, no shortness of breath Gastrointestinal: no nausea/vomiting/diarrhea Musculoskeletal: no muscle/joint aches, complains of intermittent right great toe pain associated with walking Skin: no rashes, no hyperemia Neurological: no tremors, no numbness, no tingling, no dizziness Psychiatric: no depression, no anxiety   Objective:    BP 135/85 (BP Location: Left Arm)   Pulse 66   Ht 6' (1.829 m)   Wt 192 lb (87.1 kg)   BMI 26.04 kg/m   Wt Readings from Last 3 Encounters:  06/03/20 192 lb (87.1 kg)  03/24/20 190 lb (86.2 kg)  10/02/19 189 lb 9.6 oz (86 kg)    BP Readings from Last 3 Encounters:  06/03/20 135/85  03/24/20 124/74  10/02/19 110/78      Physical Exam- Limited  Constitutional:  Body mass index is 26.04 kg/m. , not in acute distress, normal state of mind Eyes:  EOMI, no exophthalmos Neck: Supple Cardiovascular: RRR, no murmers, rubs, or gallops, no edema Respiratory: Adequate breathing efforts, no crackles, rales, rhonchi, or wheezing Musculoskeletal: no gross deformities, strength intact in all four extremities, no gross restriction of joint movements Skin:  no rashes,  no hyperemia Neurological: no tremor with outstretched hands   Foot exam:   No rashes, ulcers, cuts, calluses, onychodystrophy.   Good pulses bilat.  Good sensation to 10 g monofilament bilat.   POCT ABI Results 06/03/20   Right ABI:  1.08      Left ABI:  1.09  Right leg systolic / diastolic: 585/27 mmHg Left leg systolic / diastolic: 782/42 mmHg  Arm systolic / diastolic: 353/61 mmHG  Detailed report will be scanned into patient chart.    Recent Results (from the past 2160 hour(s))  Urinalysis, Routine w reflex microscopic  Urine, Clean Catch     Status: None   Collection Time: 03/24/20 11:26 AM  Result Value Ref Range   Color, Urine YELLOW YELLOW   APPearance CLEAR CLEAR   Specific Gravity, Urine 1.012 1.005 - 1.030   pH 6.0 5.0 - 8.0   Glucose, UA NEGATIVE NEGATIVE mg/dL   Hgb urine dipstick NEGATIVE NEGATIVE   Bilirubin Urine NEGATIVE NEGATIVE   Ketones, ur NEGATIVE NEGATIVE mg/dL   Protein, ur NEGATIVE NEGATIVE mg/dL   Nitrite NEGATIVE NEGATIVE   Leukocytes,Ua NEGATIVE NEGATIVE    Comment: Performed at [facility], [address] [place], [place] [address]  Lipase, blood     Status: None   Collection Time: 03/24/20 12:27 PM  Result Value Ref Range   Lipase 32 11 - 51 U/L    Comment: Performed at [facility], [address] [place], [place] [address]  Comprehensive metabolic panel     Status: Abnormal   Collection Time: 03/24/20 12:27 PM  Result Value Ref Range   Sodium 137 135 - 145 mmol/L   Potassium 3.2 (L) 3.5 - 5.1 mmol/L   Chloride 105 98 - 111 mmol/L   CO2 26 22 - 32 mmol/L   Glucose, Bld 102 (H) 70 - 99 mg/dL    Comment: Glucose reference range applies only to samples taken after fasting for at least 8 hours.   BUN 15 8 - 23 mg/dL   Creatinine, Ser 1.48 (H) 0.61 - 1.24 mg/dL   Calcium 8.9 8.9 - 10.3 mg/dL   Total Protein 6.7 6.5 - 8.1 g/dL   Albumin 3.6 3.5 - 5.0 g/dL   AST 23 15 - 41 U/L   ALT 21 0 - 44 U/L   Alkaline Phosphatase 40 38 - 126 U/L   Total Bilirubin 0.7 0.3 - 1.2 mg/dL   GFR, Estimated 52 (L) >60 mL/min    Comment: (NOTE) Calculated using the CKD-EPI Creatinine Equation (2021)    Anion gap 6 5 - 15    Comment: Performed at [facility], [address], [place], [place] [address]  CBC     Status: Abnormal   Collection Time: 03/24/20 12:27 PM  Result Value Ref Range   WBC 7.3 4.0 - 10.5 K/uL   RBC 4.09 (L) 4.22 - 5.81 MIL/uL   Hemoglobin 12.4 (L) 13.0 - 17.0 g/dL   HCT 37.6 (L) 39.0 - 52.0 %   MCV 91.9 80.0 - 100.0 fL   MCH 30.3 26.0 - 34.0 pg   MCHC 33.0  30.0 - 36.0 g/dL   RDW 12.9 11.5 - 15.5 %   Platelets 251 150 - 400 K/uL   nRBC 0.0 0.0 - 0.2 %    Comment: Performed at [facility], [address], [place],  [address]  Differential     Status: None   Collection Time: 03/24/20 12:27 PM  Result Value Ref Range   Neutrophils  Relative % 68 %   Neutro Abs 5.0 1.7 - 7.7 K/uL   Lymphocytes Relative 20 %   Lymphs Abs 1.5 0.7 - 4.0 K/uL   Monocytes Relative 9 %   Monocytes Absolute 0.7 0.1 - 1.0 K/uL   Eosinophils Relative 2 %   Eosinophils Absolute 0.2 0.0 - 0.5 K/uL   Basophils Relative 1 %   Basophils Absolute 0.1 0.0 - 0.1 K/uL   Immature Granulocytes 0 %   Abs Immature Granulocytes 0.01 0.00 - 0.07 K/uL    Comment: Performed at [facility], [address], [place], [place] [address]  Troponin I (High Sensitivity)     Status: None   Collection Time: 03/24/20 12:27 PM  Result Value Ref Range   Troponin I (High Sensitivity) 3 <18 ng/L    Comment: (NOTE) Elevated high sensitivity troponin I (hsTnI) values and significant  changes across serial measurements may suggest ACS but many other  chronic and acute conditions are known to elevate hsTnI results.  Refer to the "Links" section for chest pain algorithms and additional  guidance. Performed at [facility], [address], [place], [place] [address]     Assessment & Plan:   1) Uncontrolled type 2 diabetes mellitus with complication, without long-term current use of insulin ([place])  He presents today with his meter and logs showing stable glycemic profile with some hypoglycemia before bed.  He reports he frequently snacks to avoid hypoglycemia.  His POCT A1c today is 7.3%, unchanged from previous visit.    - His diabetes is complicated by CKD, however patient remains at a high risk for more acute and chronic complications of diabetes which include CAD, CVA, CKD, retinopathy, and neuropathy. These are all discussed in detail with the patient.  - Nutritional counseling  repeated at each appointment due to patients tendency to fall back in to old habits.  - The patient admits there is a room for improvement in their diet and drink choices. -  Suggestion is made for the patient to avoid simple carbohydrates from their diet including Cakes, Sweet Desserts / Pastries, Ice Cream, Soda (diet and regular), Sweet Tea, Candies, Chips, Cookies, Sweet Pastries,  Store Bought Juices, Alcohol in Excess of  1-2 drinks a day, Artificial Sweeteners, Coffee Creamer, and "Sugar-free" Products. This will help patient to have stable blood glucose profile and potentially avoid unintended weight gain.   - I encouraged the patient to switch to  unprocessed or minimally processed complex starch and increased protein intake (animal or plant source), fruits, and vegetables.   - Patient is advised to stick to a routine mealtimes to eat 3 meals  a day and avoid unnecessary snacks ( to snack only to correct hypoglycemia).  - I have approached patient with the following individualized plan to manage diabetes and patient agrees:   -Although he has a stable glycemic profile, due to his need to snack to avoid hypoglycemia, he will tolerate decrease in his Metformin to 500 mg po once daily with breakfast (hopefully will help his kidney function as well).  He can continue Glipizide 5 mg XL daily for now (may need to lower dose at next visit if still experiencing hypoglycemia).  -He is encouraged to continue monitoring blood glucose at least once daily, before breakfast and to call the clinic if his readings are less than 70 or greater than 200 for 3 tests in a row.  - Patient specific target  A1c;  LDL, HDL, Triglycerides, and  Waist Circumference were  discussed in detail.  2) BP/HTN:  His blood pressure is controlled to target.  He is advised to continue Lisinopril-HCT 20-12.5 mg po daily.   3) Lipids/HPL:  His most recent lipid panel from 01/23/20 shows controlled LDL at 71.  He is advised to  continue Lipitor 10 mg po daily at bedtime.  Side effects and precautions discussed with him.  4)  Weight/Diet:  His Body mass index is 26.04 kg/m.-- not a candidate for major weight loss.  CDE Consult has been , exercise, and detailed carbohydrates information provided.  5) Hypocalcemia-1st notice on 09/23/19.   His most recent Calcium level is WNL at 8.9.  He will need repeat measurements of calcium on subsequent visits.  6) Chronic Care/Health Maintenance: -Patient is on ACEI/ARB and Statin medications and encouraged to continue to follow up with Ophthalmology, Podiatrist at least yearly or according to recommendations, and advised to   stay away from smoking. I have recommended yearly flu vaccine and pneumonia vaccination at least every 5 years; moderate intensity exercise for up to 150 minutes weekly; and  sleep for at least 7 hours a day.  - I advised patient to maintain close follow up with Leeanne Rio, MD for primary care needs.     I spent 40 minutes in the care of the patient today including review of labs from [place], Lipids, Thyroid Function, Hematology (current and previous including abstractions from other facilities); face-to-face time discussing  his blood glucose readings/logs, discussing hypoglycemia and hyperglycemia episodes and symptoms, medications doses, his options of short and long term treatment based on the latest standards of care / guidelines;  discussion about incorporating lifestyle medicine;  and documenting the encounter.    Please refer to Patient Instructions for Blood Glucose Monitoring and Insulin/Medications Dosing Guide"  in media tab for additional information. Please  also refer to " Patient Self Inventory" in the Media  tab for reviewed elements of pertinent patient history.  Darryl Mayer participated in the discussions, expressed understanding, and voiced agreement with the above plans.  All questions were answered to his satisfaction. he is  encouraged to contact clinic should he have any questions or concerns prior to his return visit.   Follow up plan: - Return in about 4 months (around 10/03/2020) for Diabetes F/U with A1c in office, Previsit labs, Bring meter and logs.  Rayetta Pigg, [facility] [address] [place], [place] [address] Phone: [phone] Fax: [phone]  06/03/2020, 9:03 AM

## 2020-06-03 NOTE — Patient Instructions (Signed)

## 2020-06-12 ENCOUNTER — Other Ambulatory Visit: Payer: Self-pay | Admitting: Nurse Practitioner

## 2020-06-12 ENCOUNTER — Other Ambulatory Visit: Payer: Self-pay | Admitting: Urology

## 2020-06-16 ENCOUNTER — Other Ambulatory Visit: Payer: Self-pay | Admitting: "Endocrinology

## 2020-07-08 ENCOUNTER — Ambulatory Visit (INDEPENDENT_AMBULATORY_CARE_PROVIDER_SITE_OTHER): Payer: Medicare HMO | Admitting: Urology

## 2020-07-08 ENCOUNTER — Other Ambulatory Visit: Payer: Self-pay

## 2020-07-08 ENCOUNTER — Encounter: Payer: Self-pay | Admitting: Urology

## 2020-07-08 VITALS — BP 133/80 | HR 71 | Temp 98.0°F | Ht 72.0 in | Wt 192.0 lb

## 2020-07-08 DIAGNOSIS — N401 Enlarged prostate with lower urinary tract symptoms: Secondary | ICD-10-CM | POA: Diagnosis not present

## 2020-07-08 DIAGNOSIS — R351 Nocturia: Secondary | ICD-10-CM | POA: Diagnosis not present

## 2020-07-08 DIAGNOSIS — N3281 Overactive bladder: Secondary | ICD-10-CM

## 2020-07-08 LAB — MICROSCOPIC EXAMINATION
Bacteria, UA: NONE SEEN
Epithelial Cells (non renal): NONE SEEN /HPF (ref 0–10)
Renal Epithel, UA: NONE SEEN /HPF
WBC, UA: NONE SEEN /HPF (ref 0–5)

## 2020-07-08 LAB — URINALYSIS, ROUTINE W REFLEX MICROSCOPIC
Bilirubin, UA: NEGATIVE
Ketones, UA: NEGATIVE
Leukocytes,UA: NEGATIVE
Nitrite, UA: NEGATIVE
Protein,UA: NEGATIVE
Specific Gravity, UA: 1.02 (ref 1.005–1.030)
Urobilinogen, Ur: 0.2 mg/dL (ref 0.2–1.0)
pH, UA: 5.5 (ref 5.0–7.5)

## 2020-07-08 MED ORDER — ALFUZOSIN HCL ER 10 MG PO TB24
10.0000 mg | ORAL_TABLET | Freq: Every day | ORAL | 3 refills | Status: DC
Start: 2020-07-08 — End: 2020-12-10

## 2020-07-08 MED ORDER — FINASTERIDE 5 MG PO TABS: 5.0000 mg | ORAL_TABLET | Freq: Every day | ORAL | 3 refills | Status: DC

## 2020-07-08 MED ORDER — MIRABEGRON ER 25 MG PO TB24: 25.0000 mg | ORAL_TABLET | Freq: Every day | ORAL | 0 refills | Status: DC

## 2020-07-08 NOTE — Progress Notes (Signed)
07/08/2020 8:52 AM   Darryl Mayer [birth year] [id number]  Referring provider: Deloria Lair., MD [place],  [place] [address]  followup BPH  HPI: Darryl Mayer is [phone] here for followup for BPH with nocturia and OAB. He is on finasteride, uroxatral and ditropan,. IPSS 6 QOL 3. He is bothered by his nocturia. Stream is good. He has mild urgency. Urinary frequency every 2-3 hours. No incontinent episodes. Overall he is happy with his urination. No other complaints today   PMH: Past Medical History:  Diagnosis Date  . Diabetes mellitus, type II ([place])   . Hyperlipidemia   . Hypertension     Surgical History: Past Surgical History:  Procedure Laterality Date  . HEMORROIDECTOMY      Home Medications:  Allergies as of 07/08/2020   No Known Allergies     Medication List       Accurate as of July 08, 2020  8:52 AM. If you have any questions, ask your nurse or doctor.        alfuzosin 10 MG 24 hr tablet Commonly known as: UROXATRAL Take 1 tablet (10 mg total) by mouth at bedtime.   aspirin 81 MG EC tablet Take by mouth.   atorvastatin 10 MG tablet Commonly known as: LIPITOR Take 10 mg by mouth daily.   baclofen 10 MG tablet Commonly known as: LIORESAL Take 10 mg by mouth 3 (three) times daily as needed.   finasteride 5 MG tablet Commonly known as: PROSCAR TAKE 1 TABLET BY MOUTH EVERY DAY   glipiZIDE 5 MG 24 hr tablet Commonly known as: GLUCOTROL XL TAKE 1 TABLET BY MOUTH EVERY DAY WITH BREAKFAST   lisinopril-hydrochlorothiazide 20-12.5 MG tablet Commonly known as: ZESTORETIC Take 2 tablets by mouth daily.   metFORMIN 500 MG tablet Commonly known as: GLUCOPHAGE TAKE 1 TABLET BY MOUTH 2 TIMES DAILY WITH A MEAL.   NIFEdipine 90 MG 24 hr tablet Commonly known as: PROCARDIA XL/NIFEDICAL-XL TAKE 1 TABLET BY MOUTH EVERY DAY   OneTouch Delica Plus [id number] Misc USE TO CHECK BLOOD SUGAR ONCE DAILY   OneTouch Verio test strip Generic drug:  glucose blood USE AS DIRECTED to monitor glucose twice daily.   OneTouch Verio w/Device Kit Use as instructed to test blood glucose twice daily   oxybutynin 10 MG 24 hr tablet Commonly known as: DITROPAN-XL TAKE 1 TABLET BY MOUTH EVERY DAY   potassium chloride 10 MEQ tablet Commonly known as: KLOR-CON Take by mouth.   tadalafil 5 MG tablet Commonly known as: CIALIS Take 5 mg by mouth daily as needed.       Allergies: No Known Allergies  Family History: Family History  Problem Relation Age of Onset  . Hypertension Father   . CAD Father   . Heart attack Father   . Hypertension Sister   . Diabetes Sister   . Hypertension Brother   . Diabetes Brother     Social History:  reports that he has never smoked. He has never used smokeless tobacco. He reports that he does not use drugs. No history on file for alcohol use.  ROS: All other review of systems were reviewed and are negative except what is noted above in HPI  Physical Exam: BP 133/80   Pulse 71   Temp 98 F (36.7 C) (Oral)   Ht 6' (1.829 m)   Wt 192 lb (87.1 kg)   BMI 26.04 kg/m   Constitutional:  Alert and oriented, No acute distress. HEENT: [place]  AT, moist mucus membranes.  Trachea midline, no masses. Cardiovascular: No clubbing, cyanosis, or edema. Respiratory: Normal respiratory effort, no increased work of breathing. GI: Abdomen is soft, nontender, nondistended, no abdominal masses GU: No CVA tenderness. Circumcised phallus. No masses/lesions on penis, testis, scrotum. Prostate 40g smooth no nodules no induration.  Lymph: No cervical or inguinal lymphadenopathy. Skin: No rashes, bruises or suspicious lesions. Neurologic: Grossly intact, no focal deficits, moving all 4 extremities. Psychiatric: Normal mood and affect.  Laboratory Data: Lab Results  Component Value Date   WBC 7.3 03/24/2020   HGB 12.4 (L) 03/24/2020   HCT 37.6 (L) 03/24/2020   MCV 91.9 03/24/2020   PLT 251 03/24/2020    Lab Results   Component Value Date   CREATININE 1.48 (H) 03/24/2020    No results found for: PSA  No results found for: TESTOSTERONE  Lab Results  Component Value Date   HGBA1C 7.3 (A) 06/03/2020    Urinalysis    Component Value Date/Time   COLORURINE YELLOW 03/24/2020 1126   [place] 03/24/2020 1126   LABSPEC 1.012 03/24/2020 1126   PHURINE 6.0 03/24/2020 1126   [place] 03/24/2020 1126   [place] 03/24/2020 1126   [place] 03/24/2020 1126   [place] 03/24/2020 1126   [place] 03/24/2020 1126   NITRITE NEGATIVE 03/24/2020 1126   [place] 03/24/2020 1126    No results found for: LABMICR, [place], RBCUA, LABEPIT, MUCUS, BACTERIA  Pertinent Imaging:  No results found for this or any previous visit.  No results found for this or any previous visit.  No results found for this or any previous visit.  No results found for this or any previous visit.  No results found for this or any previous visit.  No results found for this or any previous visit.  No results found for this or any previous visit.  No results found for this or any previous visit.   Assessment & Plan:    1. Benign localized prostatic hyperplasia with lower urinary tract symptoms (LUTS) -continue uroxatral and finasteride - Urinalysis, Routine w reflex microscopic  2. Nocturia Continue uroxatral and finasteride  3. OAB (overactive bladder) -we will trial mirabegron $RemoveBeforeDE'25mg'[id number]$  daily   No follow-ups on file.  Nicolette Bang, MD  [facility] [place]

## 2020-07-08 NOTE — Progress Notes (Signed)
Urological Symptom Review  Patient is experiencing the following symptoms: Getting up at night to urinate Hard to postpone urine Frequent urination   Review of Systems  Gastrointestinal (upper)  : Negative for upper GI symptoms  Gastrointestinal (lower) : Negative for lower GI symptoms  Constitutional : Negative for symptoms  Skin: Negative for skin symptoms  Eyes: Negative for eye symptoms  Ear/Nose/Throat : Negative for Ear/Nose/Throat symptoms  Hematologic/Lymphatic: Negative for Hematologic/Lymphatic symptoms  Cardiovascular : none  Respiratory : Negative for respiratory symptoms  Endocrine: Negative for endocrine symptoms  Musculoskeletal: Negative for musculoskeletal symptoms  Neurological: Negative for neurological symptoms  Psychologic: Negative for psychiatric symptoms '

## 2020-07-09 ENCOUNTER — Ambulatory Visit: Payer: BLUE CROSS/BLUE SHIELD | Admitting: Urology

## 2020-07-14 ENCOUNTER — Encounter: Payer: Self-pay | Admitting: Urology

## 2020-07-14 NOTE — Patient Instructions (Signed)

## 2020-08-11 ENCOUNTER — Other Ambulatory Visit: Payer: Self-pay

## 2020-08-11 DIAGNOSIS — N3281 Overactive bladder: Secondary | ICD-10-CM

## 2020-08-11 MED ORDER — MIRABEGRON ER 25 MG PO TB24: 25.0000 mg | ORAL_TABLET | Freq: Every day | ORAL | 11 refills | Status: DC

## 2020-08-11 NOTE — Progress Notes (Signed)
Patient stated Myrbetriq samples was working for him and request a prescription be sent in.

## 2020-09-08 ENCOUNTER — Other Ambulatory Visit: Payer: Self-pay | Admitting: "Endocrinology

## 2020-09-28 ENCOUNTER — Telehealth: Payer: Self-pay

## 2020-09-28 NOTE — Telephone Encounter (Signed)
Patient is needing a refill asap on:  alfuzosin (UROXATRAL) 10 MG 24 hr tablet  [facility]  [id number] [place]  [phone] Fax:  [phone]    Thanks, THelene Kelp

## 2020-09-28 NOTE — Telephone Encounter (Signed)
Patient made aware that rx was sent to [facility] in [place] in June. Patient will call to see if they can fill. If not patient will call office back.

## 2020-10-05 ENCOUNTER — Ambulatory Visit: Payer: Medicare HMO | Admitting: Nurse Practitioner

## 2020-10-13 ENCOUNTER — Other Ambulatory Visit: Payer: Self-pay | Admitting: "Endocrinology

## 2020-10-20 ENCOUNTER — Encounter: Payer: Self-pay | Admitting: Nurse Practitioner

## 2020-10-20 ENCOUNTER — Ambulatory Visit: Payer: Medicare HMO | Admitting: Nurse Practitioner

## 2020-10-20 VITALS — BP 105/60 | HR 60 | Ht 72.0 in | Wt 185.0 lb

## 2020-10-20 DIAGNOSIS — E1165 Type 2 diabetes mellitus with hyperglycemia: Secondary | ICD-10-CM | POA: Diagnosis not present

## 2020-10-20 DIAGNOSIS — E118 Type 2 diabetes mellitus with unspecified complications: Secondary | ICD-10-CM | POA: Diagnosis not present

## 2020-10-20 DIAGNOSIS — I10 Essential (primary) hypertension: Secondary | ICD-10-CM

## 2020-10-20 DIAGNOSIS — E782 Mixed hyperlipidemia: Secondary | ICD-10-CM

## 2020-10-20 DIAGNOSIS — IMO0002 Reserved for concepts with insufficient information to code with codable children: Secondary | ICD-10-CM

## 2020-10-20 LAB — POCT GLYCOSYLATED HEMOGLOBIN (HGB A1C): Hemoglobin A1C: 7.3 % — AB (ref 4.0–5.6)

## 2020-10-20 MED ORDER — ONETOUCH DELICA PLUS LANCET30G MISC: 3 refills | Status: DC

## 2020-10-20 NOTE — Patient Instructions (Signed)
Diabetes Mellitus and Nutrition, Adult When you have diabetes, or diabetes mellitus, it is very important to have healthy eating habits because your blood sugar (glucose) levels are greatly affected by what you eat and drink. Eating healthy foods in the right amounts, at about the same times every day, can help you:  Control your blood glucose.  Lower your risk of heart disease.  Improve your blood pressure.  Reach or maintain a healthy weight. What can affect my meal plan? Every person with diabetes is different, and each person has different needs for a meal plan. Your health care provider may recommend that you work with a dietitian to make a meal plan that is best for you. Your meal plan may vary depending on factors such as:  The calories you need.  The medicines you take.  Your weight.  Your blood glucose, blood pressure, and cholesterol levels.  Your activity level.  Other health conditions you have, such as heart or kidney disease. How do carbohydrates affect me? Carbohydrates, also called carbs, affect your blood glucose level more than any other type of food. Eating carbs naturally raises the amount of glucose in your blood. Carb counting is a method for keeping track of how many carbs you eat. Counting carbs is important to keep your blood glucose at a healthy level, especially if you use insulin or take certain oral diabetes medicines. It is important to know how many carbs you can safely have in each meal. This is different for every person. Your dietitian can help you calculate how many carbs you should have at each meal and for each snack. How does alcohol affect me? Alcohol can cause a sudden decrease in blood glucose (hypoglycemia), especially if you use insulin or take certain oral diabetes medicines. Hypoglycemia can be a life-threatening condition. Symptoms of hypoglycemia, such as sleepiness, dizziness, and confusion, are similar to symptoms of having too much  alcohol.  Do not drink alcohol if: ? Your health care provider tells you not to drink. ? You are pregnant, may be pregnant, or are planning to become pregnant.  If you drink alcohol: ? Do not drink on an empty stomach. ? Limit how much you use to:  0-1 drink a day for women.  0-2 drinks a day for men. ? Be aware of how much alcohol is in your drink. In the [place] one drink equals one 12 oz bottle of beer (355 mL), one 5 oz glass of wine (148 mL), or one 1 oz glass of hard liquor (44 mL). ? Keep yourself hydrated with water, diet soda, or unsweetened iced tea.  Keep in mind that regular soda, juice, and other mixers may contain a lot of sugar and must be counted as carbs. What are tips for following this plan? Reading food labels  Start by checking the serving size on the "Nutrition Facts" label of packaged foods and drinks. The amount of calories, carbs, fats, and other nutrients listed on the label is based on one serving of the item. Many items contain more than one serving per package.  Check the total grams (g) of carbs in one serving. You can calculate the number of servings of carbs in one serving by dividing the total carbs by 15. For example, if a food has 30 g of total carbs per serving, it would be equal to 2 servings of carbs.  Check the number of grams (g) of saturated fats and trans fats in one serving. Choose foods that have   a low amount or none of these fats.  Check the number of milligrams (mg) of salt (sodium) in one serving. Most people should limit total sodium intake to less than 2,300 mg per day.  Always check the nutrition information of foods labeled as "low-fat" or "nonfat." These foods may be higher in added sugar or refined carbs and should be avoided.  Talk to your dietitian to identify your daily goals for nutrients listed on the label. Shopping  Avoid buying canned, pre-made, or processed foods. These foods tend to be high in fat, sodium, and added  sugar.  Shop around the outside edge of the grocery store. This is where you will most often find fresh fruits and vegetables, bulk grains, fresh meats, and fresh dairy. Cooking  Use low-heat cooking methods, such as baking, instead of high-heat cooking methods like deep frying.  Cook using healthy oils, such as olive, canola, or sunflower oil.  Avoid cooking with butter, cream, or high-fat meats. Meal planning  Eat meals and snacks regularly, preferably at the same times every day. Avoid going long periods of time without eating.  Eat foods that are high in fiber, such as fresh fruits, vegetables, beans, and whole grains. Talk with your dietitian about how many servings of carbs you can eat at each meal.  Eat 4-6 oz (112-168 g) of lean protein each day, such as lean meat, chicken, fish, eggs, or tofu. One ounce (oz) of lean protein is equal to: ? 1 oz (28 g) of meat, chicken, or fish. ? 1 egg. ?  cup (62 g) of tofu.  Eat some foods each day that contain healthy fats, such as avocado, nuts, seeds, and fish.   What foods should I eat? Fruits Berries. Apples. Oranges. Peaches. Apricots. Plums. Grapes. Mango. Papaya. Pomegranate. Kiwi. Cherries. Vegetables Lettuce. Spinach. Leafy greens, including kale, chard, collard greens, and mustard greens. Beets. Cauliflower. Cabbage. Broccoli. Carrots. Green beans. Tomatoes. Peppers. Onions. Cucumbers. Brussels sprouts. Grains Whole grains, such as whole-wheat or whole-grain bread, crackers, tortillas, cereal, and pasta. Unsweetened oatmeal. Quinoa. Brown or wild rice. Meats and other proteins Seafood. Poultry without skin. Lean cuts of poultry and beef. Tofu. Nuts. Seeds. Dairy Low-fat or fat-free dairy products such as milk, yogurt, and cheese. The items listed above may not be a complete list of foods and beverages you can eat. Contact a dietitian for more information. What foods should I avoid? Fruits Fruits canned with  syrup. Vegetables Canned vegetables. Frozen vegetables with butter or cream sauce. Grains Refined white flour and flour products such as bread, pasta, snack foods, and cereals. Avoid all processed foods. Meats and other proteins Fatty cuts of meat. Poultry with skin. Breaded or fried meats. Processed meat. Avoid saturated fats. Dairy Full-fat yogurt, cheese, or milk. Beverages Sweetened drinks, such as soda or iced tea. The items listed above may not be a complete list of foods and beverages you should avoid. Contact a dietitian for more information. Questions to ask a health care provider  Do I need to meet with a diabetes educator?  Do I need to meet with a dietitian?  What number can I call if I have questions?  When are the best times to check my blood glucose? Where to find more information:  American Diabetes Association: diabetes.org  Academy of Nutrition and Dietetics: www.eatright.org  National Institute of Diabetes and Digestive and Kidney Diseases: www.niddk.nih.gov  Association of Diabetes Care and Education Specialists: www.diabeteseducator.org Summary  It is important to have healthy eating   habits because your blood sugar (glucose) levels are greatly affected by what you eat and drink.  A healthy meal plan will help you control your blood glucose and maintain a healthy lifestyle.  Your health care provider may recommend that you work with a dietitian to make a meal plan that is best for you.  Keep in mind that carbohydrates (carbs) and alcohol have immediate effects on your blood glucose levels. It is important to count carbs and to use alcohol carefully. This information is not intended to replace advice given to you by your health care provider. Make sure you discuss any questions you have with your health care provider. Document Revised: 01/01/2019 Document Reviewed: 01/01/2019 Elsevier Patient Education  2021 Elsevier Inc.  

## 2020-10-20 NOTE — Progress Notes (Signed)
10/20/2020                    Endocrinology follow-up note    Subjective:    Patient ID: Darryl Mayer, male    DOB: 1955/10/05. Patient is being seen in follow-up  for management of diabetes requested by  Leeanne Rio, MD  Past Medical History:  Diagnosis Date   Diabetes mellitus, type II ([place])    Hyperlipidemia    Hypertension    Past Surgical History:  Procedure Laterality Date   HEMORROIDECTOMY     Social History   Socioeconomic History   Marital status: Married    Spouse name: Not on file   Number of children: Not on file   Years of education: Not on file   Highest education level: Not on file  Occupational History   Not on file  Tobacco Use   Smoking status: Never   Smokeless tobacco: Never  Vaping Use   Vaping Use: Never used  Substance and Sexual Activity   Alcohol use: Not on file   Drug use: No   Sexual activity: Not on file  Other Topics Concern   Not on file  Social History Narrative   Not on file   Social Determinants of Health   Financial Resource Strain: Not on file  Food Insecurity: Not on file  Transportation Needs: Not on file  Physical Activity: Not on file  Stress: Not on file  Social Connections: Not on file   Outpatient Encounter Medications as of 10/20/2020  Medication Sig   alfuzosin (UROXATRAL) 10 MG 24 hr tablet Take 1 tablet (10 mg total) by mouth at bedtime.   aspirin 81 MG EC tablet Take by mouth.   atorvastatin (LIPITOR) 10 MG tablet Take 10 mg by mouth daily.   baclofen (LIORESAL) 10 MG tablet Take 10 mg by mouth 3 (three) times daily as needed.   Blood Glucose Monitoring Suppl (ONETOUCH VERIO) w/Device KIT Use as instructed to test blood glucose twice daily   finasteride (PROSCAR) 5 MG tablet Take 1 tablet (5 mg total) by mouth daily.   glipiZIDE (GLUCOTROL XL) 5 MG 24 hr tablet TAKE 1 TABLET BY MOUTH EVERY DAY WITH BREAKFAST   glucose blood (ONETOUCH VERIO) test strip USE AS DIRECTED to monitor glucose twice daily.    lisinopril-hydrochlorothiazide (PRINZIDE,ZESTORETIC) 20-12.5 MG tablet Take 2 tablets by mouth daily.   metFORMIN (GLUCOPHAGE) 500 MG tablet Take 1 tablet (500 mg total) by mouth daily with breakfast.   mirabegron ER (MYRBETRIQ) 25 MG TB24 tablet Take 1 tablet (25 mg total) by mouth daily.   NIFEdipine (PROCARDIA XL/NIFEDICAL-XL) 90 MG 24 hr tablet TAKE 1 TABLET BY MOUTH EVERY DAY   oxybutynin (DITROPAN-XL) 10 MG 24 hr tablet TAKE 1 TABLET BY MOUTH EVERY DAY   potassium chloride (KLOR-CON) 10 MEQ tablet Take by mouth.   tadalafil (CIALIS) 5 MG tablet Take 5 mg by mouth daily as needed.   [DISCONTINUED] Lancets (ONETOUCH DELICA PLUS [id number]) [place] USE TO CHECK BLOOD SUGAR ONCE DAILY   Lancets (ONETOUCH DELICA PLUS [id number]) [place] USE TO CHECK BLOOD SUGAR ONCE DAILY   No facility-administered encounter medications on file as of 10/20/2020.   ALLERGIES: No Known Allergies VACCINATION STATUS: Immunization History  Administered Date(s) Administered   PFIZER(Purple Top)SARS-COV-2 Vaccination 11/25/2019    Diabetes He presents for his follow-up diabetic visit. He has type 2 diabetes mellitus. Onset time: He was diagnosed at approximate age of 30 years. His disease course has been  stable. There are no hypoglycemic associated symptoms. Pertinent negatives for hypoglycemia include no confusion, headaches, pallor or seizures. Pertinent negatives for diabetes include no chest pain, no fatigue, no polydipsia, no polyphagia, no polyuria and no weakness. There are no hypoglycemic complications. Symptoms are stable. Diabetic complications include impotence and nephropathy. Risk factors for coronary artery disease include diabetes mellitus, dyslipidemia, male sex, hypertension and sedentary lifestyle. Current diabetic treatment includes oral agent (dual therapy). He is compliant with treatment all of the time. His weight is decreasing steadily. He is following a generally healthy diet. When asked about meal  planning, he reported none. He has not had a previous visit with a dietitian. He never participates in exercise. His home blood glucose trend is fluctuating minimally. His overall blood glucose range is 130-140 mg/dl. (He presents today with his meter and logs showing slightly above target fasting and at goal postprandial glycemic profile.  His POCT A1c today is 7.3%, unchanged from previous visit.  He has lost 7 lbs since last visit, says he is staying active by mowing yards in the summer. He denies any hypoglycemia.) An ACE inhibitor/angiotensin II receptor blocker is being taken. He does not see a podiatrist.Eye exam is current.  Hyperlipidemia This is a chronic problem. The current episode started more than 1 year ago. The problem is controlled. Recent lipid tests were reviewed and are normal. Exacerbating diseases include chronic renal disease and diabetes. Factors aggravating his hyperlipidemia include thiazides. Pertinent negatives include no chest pain, myalgias or shortness of breath. Current antihyperlipidemic treatment includes statins. The current treatment provides moderate improvement of lipids. There are no compliance problems.  Risk factors for coronary artery disease include diabetes mellitus, dyslipidemia, hypertension, male sex and a sedentary lifestyle.  Hypertension This is a chronic problem. The current episode started more than 1 year ago. The problem has been resolved since onset. The problem is controlled. Pertinent negatives include no chest pain, headaches, neck pain, palpitations or shortness of breath. There are no associated agents to hypertension. Risk factors for coronary artery disease include dyslipidemia, diabetes mellitus, male gender and sedentary lifestyle. Past treatments include diuretics and ACE inhibitors. The current treatment provides moderate improvement. There are no compliance problems.  Hypertensive end-organ damage includes kidney disease. Identifiable causes of  hypertension include chronic renal disease.    Review of systems  Constitutional: + steadily decreasing body weight-mows yards in the summer,  current Body mass index is 25.09 kg/m. , no fatigue, no subjective hyperthermia, no subjective hypothermia Eyes: no blurry vision, no xerophthalmia ENT: no sore throat, no nodules palpated in throat, no dysphagia/odynophagia, no hoarseness Cardiovascular: no chest pain, no shortness of breath, no palpitations, no leg swelling Respiratory: no cough, no shortness of breath Gastrointestinal: no nausea/vomiting/diarrhea Musculoskeletal: no muscle/joint aches, complains of intermittent right great toe pain associated with walking-continued Skin: no rashes, no hyperemia Neurological: no tremors, no numbness, no tingling, no dizziness Psychiatric: no depression, no anxiety   Objective:    BP 105/60   Pulse 60   Ht 6' (1.829 m)   Wt 185 lb (83.9 kg)   BMI 25.09 kg/m   Wt Readings from Last 3 Encounters:  10/20/20 185 lb (83.9 kg)  07/08/20 192 lb (87.1 kg)  06/03/20 192 lb (87.1 kg)    BP Readings from Last 3 Encounters:  10/20/20 105/60  07/08/20 133/80  06/03/20 135/85      Physical Exam- Limited  Constitutional:  Body mass index is 25.09 kg/m. , not in acute distress, normal  state of mind Eyes:  EOMI, no exophthalmos Neck: Supple Cardiovascular: RRR, no murmurs, rubs, or gallops, no edema Respiratory: Adequate breathing efforts, no crackles, rales, rhonchi, or wheezing Musculoskeletal: no gross deformities, strength intact in all four extremities, no gross restriction of joint movements Skin:  no rashes, no hyperemia Neurological: no tremor with outstretched hands     Recent Results (from the past 2160 hour(s))  POCT glycosylated hemoglobin (Hb A1C)     Status: Abnormal   Collection Time: 10/20/20  8:36 AM  Result Value Ref Range   Hemoglobin A1C 7.3 (A) 4.0 - 5.6 %   HbA1c POC (<> result, manual entry)     HbA1c, POC  (prediabetic range)     HbA1c, POC (controlled diabetic range)       Assessment & Plan:   1) Uncontrolled type 2 diabetes mellitus with complication, without long-term current use of insulin ([place])  He presents today with his meter and logs showing slightly above target fasting and at goal postprandial glycemic profile.  His POCT A1c today is 7.3%, unchanged from previous visit.  He has lost 7 lbs since last visit, says he is staying active by mowing yards in the summer. He denies any hypoglycemia.  - His diabetes is complicated by CKD, however patient remains at a high risk for more acute and chronic complications of diabetes which include CAD, CVA, CKD, retinopathy, and neuropathy. These are all discussed in detail with the patient.  - Nutritional counseling repeated at each appointment due to patients tendency to fall back in to old habits.  - The patient admits there is a room for improvement in their diet and drink choices. -  Suggestion is made for the patient to avoid simple carbohydrates from their diet including Cakes, Sweet Desserts / Pastries, Ice Cream, Soda (diet and regular), Sweet Tea, Candies, Chips, Cookies, Sweet Pastries, Store Bought Juices, Alcohol in Excess of 1-2 drinks a day, Artificial Sweeteners, Coffee Creamer, and "Sugar-free" Products. This will help patient to have stable blood glucose profile and potentially avoid unintended weight gain.   - I encouraged the patient to switch to unprocessed or minimally processed complex starch and increased protein intake (animal or plant source), fruits, and vegetables.   - Patient is advised to stick to a routine mealtimes to eat 3 meals a day and avoid unnecessary snacks (to snack only to correct hypoglycemia).  - I have approached patient with the following individualized plan to manage diabetes and patient agrees:   -Based on his stable glycemic profile, he is advised to continue current medication regimen of Metformin 500  mg po daily with breakfast and Glipizide 5 mg XL daily with breakfast.  He is advised to limit his simple carbs and he should be able to achieve better control of his diabetes without adding more medication.    -He is encouraged to continue monitoring blood glucose at least once daily, before breakfast and to call the clinic if his readings are less than 70 or greater than 200 for 3 tests in a row.  - Patient specific target  A1c;  LDL, HDL, Triglycerides, and  Waist Circumference were discussed in detail.  2) BP/HTN:  His blood pressure is controlled to target.  He is advised to continue Lisinopril-HCT 20-12.5 mg po daily.   3) Lipids/HPL:  His most recent lipid panel from 01/23/20 shows controlled LDL at 71.  He is advised to continue Lipitor 10 mg po daily at bedtime.  Side effects and precautions discussed  with him.  Will recheck lipid panel prior to next visit.  4)  Weight/Diet:  His Body mass index is 25.09 kg/m.-- not a candidate for major weight loss.  CDE Consult has been , exercise, and detailed carbohydrates information provided.  5) Hypocalcemia-1st notice on 09/23/19.   His most recent Calcium level is WNL at 8.9.  He will need repeat measurements of calcium on subsequent visits. Will recheck CMP prior to next visit.  6) Chronic Care/Health Maintenance: -Patient is on ACEI/ARB and Statin medications and encouraged to continue to follow up with Ophthalmology, Podiatrist at least yearly or according to recommendations, and advised to   stay away from smoking. I have recommended yearly flu vaccine and pneumonia vaccination at least every 5 years; moderate intensity exercise for up to 150 minutes weekly; and  sleep for at least 7 hours a day.  - I advised patient to maintain close follow up with Leeanne Rio, MD for primary care needs.      I spent 30 minutes in the care of the patient today including review of labs from [place], Lipids, Thyroid Function, Hematology (current and  previous including abstractions from other facilities); face-to-face time discussing  his blood glucose readings/logs, discussing hypoglycemia and hyperglycemia episodes and symptoms, medications doses, his options of short and long term treatment based on the latest standards of care / guidelines;  discussion about incorporating lifestyle medicine;  and documenting the encounter.    Please refer to Patient Instructions for Blood Glucose Monitoring and Insulin/Medications Dosing Guide"  in media tab for additional information. Please  also refer to " Patient Self Inventory" in the Media  tab for reviewed elements of pertinent patient history.  Sakib Frieze participated in the discussions, expressed understanding, and voiced agreement with the above plans.  All questions were answered to his satisfaction. he is encouraged to contact clinic should he have any questions or concerns prior to his return visit.   Follow up plan: - Return in about 4 months (around 02/19/2021) for Diabetes F/U with A1c in office, Previsit labs, Bring meter and logs.  Rayetta Pigg, [facility] [address] [place], [place] [address] Phone: [phone] Fax: [phone]  10/20/2020, 8:47 AM

## 2020-12-05 ENCOUNTER — Other Ambulatory Visit: Payer: Self-pay | Admitting: Urology

## 2020-12-05 ENCOUNTER — Other Ambulatory Visit: Payer: Self-pay | Admitting: "Endocrinology

## 2020-12-05 DIAGNOSIS — N3281 Overactive bladder: Secondary | ICD-10-CM

## 2021-01-14 LAB — HM DIABETES EYE EXAM

## 2021-01-29 ENCOUNTER — Other Ambulatory Visit: Payer: Self-pay | Admitting: "Endocrinology

## 2021-02-16 LAB — BASIC METABOLIC PANEL
BUN: 19 (ref 4–21)
CO2: 31 — AB (ref 13–22)
Chloride: 107 (ref 99–108)
Creatinine: 1.5 — AB (ref <=1.3)
Glucose: 145
Potassium: 3.8 (ref 3.4–5.3)
Sodium: 144 (ref 137–147)

## 2021-02-16 LAB — HEPATIC FUNCTION PANEL
ALT: 38 (ref 10–40)
AST: 20 (ref 14–40)
Alkaline Phosphatase: 37 (ref 25–125)
Bilirubin, Total: 0.8

## 2021-02-16 LAB — LIPID PANEL
Cholesterol: 139 (ref 0–200)
HDL: 51 (ref 35–70)
LDL Cholesterol: 72
Triglycerides: 82 (ref 40–160)

## 2021-02-16 LAB — TSH: TSH: 2.77 (ref <=5.90)

## 2021-02-16 LAB — COMPREHENSIVE METABOLIC PANEL
Albumin: 3.3 — AB (ref 3.5–5.0)
Calcium: 8.9 (ref 8.7–10.7)

## 2021-02-22 NOTE — Patient Instructions (Signed)

## 2021-02-23 ENCOUNTER — Other Ambulatory Visit: Payer: Self-pay

## 2021-02-23 ENCOUNTER — Ambulatory Visit: Payer: Medicare HMO | Admitting: Nurse Practitioner

## 2021-02-23 ENCOUNTER — Encounter: Payer: Self-pay | Admitting: Nurse Practitioner

## 2021-02-23 VITALS — BP 137/73 | HR 70 | Ht 72.0 in | Wt 182.2 lb

## 2021-02-23 DIAGNOSIS — I10 Essential (primary) hypertension: Secondary | ICD-10-CM

## 2021-02-23 DIAGNOSIS — N1831 Chronic kidney disease, stage 3a: Secondary | ICD-10-CM

## 2021-02-23 DIAGNOSIS — E1122 Type 2 diabetes mellitus with diabetic chronic kidney disease: Secondary | ICD-10-CM

## 2021-02-23 DIAGNOSIS — E782 Mixed hyperlipidemia: Secondary | ICD-10-CM | POA: Diagnosis not present

## 2021-02-23 LAB — POCT GLYCOSYLATED HEMOGLOBIN (HGB A1C): HbA1c, POC (controlled diabetic range): 7.7 % — AB (ref 0.0–7.0)

## 2021-02-23 MED ORDER — GLIPIZIDE ER 5 MG PO TB24: 5.0000 mg | ORAL_TABLET | Freq: Every day | ORAL | 3 refills | Status: DC

## 2021-02-23 MED ORDER — METFORMIN HCL 500 MG PO TABS: 500.0000 mg | ORAL_TABLET | Freq: Every day | ORAL | 3 refills | Status: DC

## 2021-02-23 NOTE — Progress Notes (Signed)
02/23/2021                    Endocrinology follow-up note    Subjective:    Patient ID: Darryl Mayer, male    DOB: 06/20/1955. Patient is being seen in follow-up  for management of diabetes requested by  Leeanne Rio, MD  Past Medical History:  Diagnosis Date   Diabetes mellitus, type II ([place])    Hyperlipidemia    Hypertension    Past Surgical History:  Procedure Laterality Date   HEMORROIDECTOMY     Social History   Socioeconomic History   Marital status: Married    Spouse name: Not on file   Number of children: Not on file   Years of education: Not on file   Highest education level: Not on file  Occupational History   Not on file  Tobacco Use   Smoking status: Never   Smokeless tobacco: Never  Vaping Use   Vaping Use: Never used  Substance and Sexual Activity   Alcohol use: Not on file   Drug use: No   Sexual activity: Not on file  Other Topics Concern   Not on file  Social History Narrative   Not on file   Social Determinants of Health   Financial Resource Strain: Not on file  Food Insecurity: Not on file  Transportation Needs: Not on file  Physical Activity: Not on file  Stress: Not on file  Social Connections: Not on file   Outpatient Encounter Medications as of 02/23/2021  Medication Sig   alfuzosin (UROXATRAL) 10 MG 24 hr tablet TAKE 1 TABLET BY MOUTH EVERYDAY AT BEDTIME   aspirin 81 MG EC tablet Take by mouth.   atorvastatin (LIPITOR) 10 MG tablet Take 10 mg by mouth daily.   baclofen (LIORESAL) 10 MG tablet Take 10 mg by mouth 3 (three) times daily as needed.   Blood Glucose Monitoring Suppl (ONETOUCH VERIO) w/Device KIT Use as instructed to test blood glucose twice daily   finasteride (PROSCAR) 5 MG tablet Take 1 tablet (5 mg total) by mouth daily.   glucose blood (ONETOUCH VERIO) test strip USE AS DIRECTED to monitor glucose twice daily.   ketoconazole (NIZORAL) 2 % shampoo Apply topically 2 (two) times a week.   Lancets (ONETOUCH  DELICA PLUS [id number]) MISC USE TO CHECK BLOOD SUGAR ONCE DAILY   latanoprost (XALATAN) 0.005 % ophthalmic solution SMARTSIG:1 In Eye(s) Every Evening   lisinopril-hydrochlorothiazide (PRINZIDE,ZESTORETIC) 20-12.5 MG tablet Take 2 tablets by mouth daily.   mirabegron ER (MYRBETRIQ) 25 MG TB24 tablet Take 1 tablet (25 mg total) by mouth daily.   NIFEdipine (PROCARDIA XL/NIFEDICAL-XL) 90 MG 24 hr tablet TAKE 1 TABLET BY MOUTH EVERY DAY   oxybutynin (DITROPAN-XL) 10 MG 24 hr tablet TAKE 1 TABLET BY MOUTH EVERY DAY   potassium chloride (KLOR-CON) 10 MEQ tablet Take by mouth.   tadalafil (CIALIS) 5 MG tablet Take 5 mg by mouth daily as needed.   [DISCONTINUED] glipiZIDE (GLUCOTROL XL) 5 MG 24 hr tablet TAKE 1 TABLET BY MOUTH EVERY DAY WITH BREAKFAST   [DISCONTINUED] metFORMIN (GLUCOPHAGE) 500 MG tablet TAKE 1 TABLET BY MOUTH EVERY DAY WITH BREAKFAST   glipiZIDE (GLUCOTROL XL) 5 MG 24 hr tablet Take 1 tablet (5 mg total) by mouth daily with breakfast.   metFORMIN (GLUCOPHAGE) 500 MG tablet Take 1 tablet (500 mg total) by mouth daily with breakfast.   No facility-administered encounter medications on file as of 02/23/2021.   ALLERGIES: No Known  Allergies VACCINATION STATUS: Immunization History  Administered Date(s) Administered   PFIZER(Purple Top)SARS-COV-2 Vaccination 11/25/2019    Diabetes He presents for his follow-up diabetic visit. He has type 2 diabetes mellitus. Onset time: He was diagnosed at approximate age of 26 years. His disease course has been stable. There are no hypoglycemic associated symptoms. Pertinent negatives for hypoglycemia include no confusion, headaches, pallor or seizures. Pertinent negatives for diabetes include no chest pain, no fatigue, no polydipsia, no polyphagia, no polyuria and no weakness. There are no hypoglycemic complications. Symptoms are stable. Diabetic complications include impotence and nephropathy. Risk factors for coronary artery disease include diabetes  mellitus, dyslipidemia, male sex, hypertension and sedentary lifestyle. Current diabetic treatment includes oral agent (dual therapy). He is compliant with treatment all of the time. His weight is decreasing steadily. He is following a generally healthy diet. When asked about meal planning, he reported none. He has not had a previous visit with a dietitian. He participates in exercise intermittently (has not been as active during the winter months). His home blood glucose trend is fluctuating minimally. His breakfast blood glucose range is generally 130-140 mg/dl. His overall blood glucose range is 140-180 mg/dl. (He presents today with his meter and logs showing stable, at goal glycemic profile both fasting and postprandially.  His POCT A1c today is 7.7%, increasing from last visit of 7.3%.  He has not been as active in the winter months as he usually is and says he overindulged with his diet over the holidays.  He has plans to start routine exercise in a gym setting soon.  He denies any hypoglycemia.  Analysis of his meter shows 7-day average of 160, 14-day average of 152, 30-day average of 155, 90-day average of 148.) An ACE inhibitor/angiotensin II receptor blocker is being taken. He does not see a podiatrist.Eye exam is current.  Hyperlipidemia This is a chronic problem. The current episode started more than 1 year ago. The problem is controlled. Recent lipid tests were reviewed and are normal. Exacerbating diseases include chronic renal disease and diabetes. Factors aggravating his hyperlipidemia include thiazides. Pertinent negatives include no chest pain, myalgias or shortness of breath. Current antihyperlipidemic treatment includes statins. The current treatment provides moderate improvement of lipids. There are no compliance problems.  Risk factors for coronary artery disease include diabetes mellitus, dyslipidemia, hypertension, male sex and a sedentary lifestyle.  Hypertension This is a chronic  problem. The current episode started more than 1 year ago. The problem has been resolved since onset. The problem is controlled. Pertinent negatives include no chest pain, headaches, neck pain, palpitations or shortness of breath. There are no associated agents to hypertension. Risk factors for coronary artery disease include dyslipidemia, diabetes mellitus, male gender and sedentary lifestyle. Past treatments include diuretics and ACE inhibitors. The current treatment provides moderate improvement. There are no compliance problems.  Hypertensive end-organ damage includes kidney disease. Identifiable causes of hypertension include chronic renal disease.    Review of systems  Constitutional: + steadily decreasing body weight,  current Body mass index is 24.71 kg/m. , no fatigue, no subjective hyperthermia, no subjective hypothermia Eyes: no blurry vision, no xerophthalmia ENT: no sore throat, no nodules palpated in throat, no dysphagia/odynophagia, no hoarseness Cardiovascular: no chest pain, no shortness of breath, no palpitations, no leg swelling Respiratory: no cough, no shortness of breath Gastrointestinal: no nausea/vomiting/diarrhea Musculoskeletal: no muscle/joint aches Skin: no rashes, no hyperemia Neurological: no tremors, no numbness, no tingling, no dizziness Psychiatric: no depression, no anxiety   Objective:  BP 137/73    Pulse 70    Ht 6' (1.829 m)    Wt 182 lb 3.2 oz (82.6 kg)    SpO2 97%    BMI 24.71 kg/m   Wt Readings from Last 3 Encounters:  02/23/21 182 lb 3.2 oz (82.6 kg)  10/20/20 185 lb (83.9 kg)  07/08/20 192 lb (87.1 kg)    BP Readings from Last 3 Encounters:  02/23/21 137/73  10/20/20 105/60  07/08/20 133/80    Physical Exam- Limited  Constitutional:  Body mass index is 24.71 kg/m. , not in acute distress, normal state of mind Eyes:  EOMI, no exophthalmos Neck: Supple Cardiovascular: RRR, no murmurs, rubs, or gallops, no edema Respiratory: Adequate  breathing efforts, no crackles, rales, rhonchi, or wheezing Musculoskeletal: no gross deformities, strength intact in all four extremities, no gross restriction of joint movements Skin:  no rashes, no hyperemia Neurological: no tremor with outstretched hands   Recent Results (from the past 2160 hour(s))  [facility] DIABETES EYE EXAM     Status: None   Collection Time: 01/14/21 12:34 PM  Result Value Ref Range   [facility] Diabetic Eye Exam    Basic metabolic panel     Status: Abnormal   Collection Time: 02/16/21 12:00 AM  Result Value Ref Range   Glucose 145    BUN 19 4 - 21   CO2 31 (A) 13 - 22   Creatinine 1.5 (A) 0.6 - 1.3   Potassium 3.8 3.4 - 5.3   Sodium 144 137 - 147   Chloride 107 99 - 108  Comprehensive metabolic panel     Status: Abnormal   Collection Time: 02/16/21 12:00 AM  Result Value Ref Range   Calcium 8.9 8.7 - 10.7   Albumin 3.3 (A) 3.5 - 5.0  Lipid panel     Status: None   Collection Time: 02/16/21 12:00 AM  Result Value Ref Range   Triglycerides 82 40 - 160   Cholesterol 139 0 - 200   HDL 51 35 - 70   LDL Cholesterol 72   Hepatic function panel     Status: None   Collection Time: 02/16/21 12:00 AM  Result Value Ref Range   Alkaline Phosphatase 37 25 - 125   ALT 38 10 - 40   AST 20 14 - 40   Bilirubin, Total 0.8   TSH     Status: None   Collection Time: 02/16/21 12:00 AM  Result Value Ref Range   TSH 2.77 0.41 - 5.90    Comment: Free T4 is 1.00  POCT glycosylated hemoglobin (Hb A1C)     Status: Abnormal   Collection Time: 02/23/21  8:42 AM  Result Value Ref Range   Hemoglobin A1C     HbA1c POC (<> result, manual entry)     HbA1c, POC (prediabetic range)     HbA1c, POC (controlled diabetic range) 7.7 (A) 0.0 - 7.0 %     Assessment & Plan:   1) Uncontrolled type 2 diabetes mellitus with complication, without long-term current use of insulin ([facility])  He presents today with his meter and logs showing stable, at goal glycemic profile both fasting and  postprandially.  His POCT A1c today is 7.7%, increasing from last visit of 7.3%.  He has not been as active in the winter months as he usually is and says he overindulged with his diet over the holidays.  He has plans to start routine exercise in a gym setting soon.  He  denies any hypoglycemia.  Analysis of his meter shows 7-day average of 160, 14-day average of 152, 30-day average of 155, 90-day average of 148.  - His diabetes is complicated by CKD, however patient remains at a high risk for more acute and chronic complications of diabetes which include CAD, CVA, CKD, retinopathy, and neuropathy. These are all discussed in detail with the patient.  - Nutritional counseling repeated at each appointment due to patients tendency to fall back in to old habits.  - The patient admits there is a room for improvement in their diet and drink choices. -  Suggestion is made for the patient to avoid simple carbohydrates from their diet including Cakes, Sweet Desserts / Pastries, Ice Cream, Soda (diet and regular), Sweet Tea, Candies, Chips, Cookies, Sweet Pastries, Store Bought Juices, Alcohol in Excess of 1-2 drinks a day, Artificial Sweeteners, Coffee Creamer, and "Sugar-free" Products. This will help patient to have stable blood glucose profile and potentially avoid unintended weight gain.   - I encouraged the patient to switch to unprocessed or minimally processed complex starch and increased protein intake (animal or plant source), fruits, and vegetables.   - Patient is advised to stick to a routine mealtimes to eat 3 meals a day and avoid unnecessary snacks (to snack only to correct hypoglycemia).  - I have approached patient with the following individualized plan to manage diabetes and patient agrees:   -Based on his stable glycemic profile, he is advised to continue current medication regimen of Metformin 500 mg po daily with breakfast and Glipizide 5 mg XL daily with breakfast.  He is advised to limit  his simple carbs and he should be able to achieve better control of his diabetes without adding more medication.  He is also encouraged to increase his exercise during the winter months.  -He is encouraged to continue monitoring blood glucose at least once daily, before breakfast and to call the clinic if his readings are less than 70 or greater than 200 for 3 tests in a row.  - Patient specific target  A1c;  LDL, HDL, Triglycerides, and  Waist Circumference were discussed in detail.  2) BP/HTN:  His blood pressure is controlled to target.  He is advised to continue Lisinopril-HCT 20-12.5 mg po daily.   3) Lipids/HPL:  His most recent lipid panel from 02/16/21 shows controlled LDL at 72.  He is advised to continue Lipitor 10 mg po daily at bedtime.  Side effects and precautions discussed with him.   4)  Weight/Diet:  His Body mass index is 24.71 kg/m.-- not a candidate for major weight loss.  CDE Consult has been , exercise, and detailed carbohydrates information provided.  5) Hypocalcemia-1st notice on 09/23/19.   His most recent Calcium level is WNL at 8.9-stable.  He will need repeat measurements of calcium on subsequent visits.   6) Chronic Care/Health Maintenance: -Patient is on ACEI/ARB and Statin medications and encouraged to continue to follow up with Ophthalmology, Podiatrist at least yearly or according to recommendations, and advised to   stay away from smoking. I have recommended yearly flu vaccine and pneumonia vaccination at least every 5 years; moderate intensity exercise for up to 150 minutes weekly; and  sleep for at least 7 hours a day.  - I advised patient to maintain close follow up with Leeanne Rio, MD for primary care needs.     I spent 30 minutes in the care of the patient today including review of labs from [facility] [facility],  Lipids, Thyroid Function, Hematology (current and previous including abstractions from other facilities); face-to-face time discussing  his blood  glucose readings/logs, discussing hypoglycemia and hyperglycemia episodes and symptoms, medications doses, his options of short and long term treatment based on the latest standards of care / guidelines;  discussion about incorporating lifestyle medicine;  and documenting the encounter.    Please refer to Patient Instructions for Blood Glucose Monitoring and Insulin/Medications Dosing Guide"  in media tab for additional information. Please  also refer to " Patient Self Inventory" in the Media  tab for reviewed elements of pertinent patient history.  Darryl Mayer participated in the discussions, expressed understanding, and voiced agreement with the above plans.  All questions were answered to his satisfaction. he is encouraged to contact clinic should he have any questions or concerns prior to his return visit.   Follow up plan: - Return in about 4 months (around 06/23/2021) for Diabetes F/U- A1c and UM in office, Bring meter and logs, No previsit labs.  Darryl Mayer, [facility] [address] [place], [place] [address] Phone: [phone] Fax: [phone]  02/23/2021, 8:50 AM

## 2021-04-20 ENCOUNTER — Telehealth: Payer: Self-pay | Admitting: Nurse Practitioner

## 2021-04-20 NOTE — Telephone Encounter (Signed)
Pt called and said that [facility] told him the lancets prescription states he is to test 1x a day but he was told 2x a day therefore his insurance is not wanting to cover it. Please advise.  ?

## 2021-04-20 NOTE — Telephone Encounter (Signed)
Since insurance will not pay for twice daily, have him just check once daily (before breakfast) and anytime he feels like it may be low.

## 2021-04-20 NOTE — Telephone Encounter (Signed)
Called the patient and gave him the message. Patient verbalized an understanding. ?

## 2021-06-05 ENCOUNTER — Other Ambulatory Visit: Payer: Self-pay | Admitting: Urology

## 2021-06-23 ENCOUNTER — Encounter: Payer: Self-pay | Admitting: Nurse Practitioner

## 2021-06-23 ENCOUNTER — Ambulatory Visit (INDEPENDENT_AMBULATORY_CARE_PROVIDER_SITE_OTHER): Payer: Medicare HMO | Admitting: Nurse Practitioner

## 2021-06-23 VITALS — BP 129/73 | HR 67 | Ht 72.0 in | Wt 184.0 lb

## 2021-06-23 DIAGNOSIS — N1831 Chronic kidney disease, stage 3a: Secondary | ICD-10-CM | POA: Diagnosis not present

## 2021-06-23 DIAGNOSIS — E1122 Type 2 diabetes mellitus with diabetic chronic kidney disease: Secondary | ICD-10-CM | POA: Diagnosis not present

## 2021-06-23 DIAGNOSIS — E782 Mixed hyperlipidemia: Secondary | ICD-10-CM

## 2021-06-23 DIAGNOSIS — I10 Essential (primary) hypertension: Secondary | ICD-10-CM | POA: Diagnosis not present

## 2021-06-23 LAB — POCT GLYCOSYLATED HEMOGLOBIN (HGB A1C): HbA1c POC (<> result, manual entry): 7.4 % (ref 4.0–5.6)

## 2021-06-23 LAB — POCT UA - MICROALBUMIN
Albumin/Creatinine Ratio, Urine, POC: <30
Creatinine, POC: 300 mg/dL
Microalbumin Ur, POC: 30 mg/L

## 2021-06-23 NOTE — Patient Instructions (Signed)
Diabetes Mellitus and Foot Care Foot care is an important part of your health, especially when you have diabetes. Diabetes may cause you to have problems because of poor blood flow (circulation) to your feet and legs, which can cause your skin to: Become thinner and drier. Break more easily. Heal more slowly. Peel and crack. You may also have nerve damage (neuropathy) in your legs and feet, causing decreased feeling in them. This means that you may not notice minor injuries to your feet that could lead to more serious problems. Noticing and addressing any potential problems early is the best way to prevent future foot problems. How to care for your feet Foot hygiene  Wash your feet daily with warm water and mild soap. Do not use hot water. Then, pat your feet and the areas between your toes until they are completely dry. Do not soak your feet as this can dry your skin. Trim your toenails straight across. Do not dig under them or around the cuticle. File the edges of your nails with an emery board or nail file. Apply a moisturizing lotion or petroleum jelly to the skin on your feet and to dry, brittle toenails. Use lotion that does not contain alcohol and is unscented. Do not apply lotion between your toes. Shoes and socks Wear clean socks or stockings every day. Make sure they are not too tight. Do not wear knee-high stockings since they may decrease blood flow to your legs. Wear shoes that fit properly and have enough cushioning. Always look in your shoes before you put them on to be sure there are no objects inside. To break in new shoes, wear them for just a few hours a day. This prevents injuries on your feet. Wounds, scrapes, corns, and calluses  Check your feet daily for blisters, cuts, bruises, sores, and redness. If you cannot see the bottom of your feet, use a mirror or ask someone for help. Do not cut corns or calluses or try to remove them with medicine. If you find a minor scrape,  cut, or break in the skin on your feet, keep it and the skin around it clean and dry. You may clean these areas with mild soap and water. Do not clean the area with peroxide, alcohol, or iodine. If you have a wound, scrape, corn, or callus on your foot, look at it several times a day to make sure it is healing and not infected. Check for: Redness, swelling, or pain. Fluid or blood. Warmth. Pus or a bad smell. General tips Do not cross your legs. This may decrease blood flow to your feet. Do not use heating pads or hot water bottles on your feet. They may burn your skin. If you have lost feeling in your feet or legs, you may not know this is happening until it is too late. Protect your feet from hot and cold by wearing shoes, such as at the beach or on hot pavement. Schedule a complete foot exam at least once a year (annually) or more often if you have foot problems. Report any cuts, sores, or bruises to your health care provider immediately. Where to find more information American Diabetes Association: www.diabetes.org Association of Diabetes Care & Education Specialists: www.diabeteseducator.org Contact a health care provider if: You have a medical condition that increases your risk of infection and you have any cuts, sores, or bruises on your feet. You have an injury that is not healing. You have redness on your legs or feet. You   feel burning or tingling in your legs or feet. You have pain or cramps in your legs and feet. Your legs or feet are numb. Your feet always feel cold. You have pain around any toenails. Get help right away if: You have a wound, scrape, corn, or callus on your foot and: You have pain, swelling, or redness that gets worse. You have fluid or blood coming from the wound, scrape, corn, or callus. Your wound, scrape, corn, or callus feels warm to the touch. You have pus or a bad smell coming from the wound, scrape, corn, or callus. You have a fever. You have a red  line going up your leg. Summary Check your feet every day for blisters, cuts, bruises, sores, and redness. Apply a moisturizing lotion or petroleum jelly to the skin on your feet and to dry, brittle toenails. Wear shoes that fit properly and have enough cushioning. If you have foot problems, report any cuts, sores, or bruises to your health care provider immediately. Schedule a complete foot exam at least once a year (annually) or more often if you have foot problems. This information is not intended to replace advice given to you by your health care provider. Make sure you discuss any questions you have with your health care provider. Document Revised: 08/15/2019 Document Reviewed: 08/15/2019 Elsevier Patient Education  2023 [employer]  

## 2021-06-23 NOTE — Progress Notes (Signed)
?06/23/2021 ?    ?               ?Endocrinology follow-up note ? ? ? ?Subjective:  ? ? Patient ID: Darryl Mayer, male    DOB: [birth year]. Patient is being seen in follow-up  for management of diabetes requested by  Leeanne Rio, MD ? ?Past Medical History:  ?Diagnosis Date  ? Diabetes mellitus, type II ([place])   ? Hyperlipidemia   ? Hypertension   ? ?Past Surgical History:  ?Procedure Laterality Date  ? HEMORROIDECTOMY    ? ?Social History  ? ?Socioeconomic History  ? Marital status: Married  ?  Spouse name: Not on file  ? Number of children: Not on file  ? Years of education: Not on file  ? Highest education level: Not on file  ?Occupational History  ? Not on file  ?Tobacco Use  ? Smoking status: Never  ? Smokeless tobacco: Never  ?Vaping Use  ? Vaping Use: Never used  ?Substance and Sexual Activity  ? Alcohol use: Not on file  ? Drug use: No  ? Sexual activity: Not on file  ?Other Topics Concern  ? Not on file  ?Social History Narrative  ? Not on file  ? ?Social Determinants of Health  ? ?Financial Resource Strain: Not on file  ?Food Insecurity: Not on file  ?Transportation Needs: Not on file  ?Physical Activity: Not on file  ?Stress: Not on file  ?Social Connections: Not on file  ? ?Outpatient Encounter Medications as of 06/23/2021  ?Medication Sig  ? alfuzosin (UROXATRAL) 10 MG 24 hr tablet TAKE 1 TABLET BY MOUTH EVERYDAY AT BEDTIME  ? aspirin 81 MG EC tablet Take by mouth.  ? atorvastatin (LIPITOR) 10 MG tablet Take 10 mg by mouth daily.  ? baclofen (LIORESAL) 10 MG tablet Take 10 mg by mouth 3 (three) times daily as needed.  ? Blood Glucose Monitoring Suppl (ONETOUCH VERIO) w/Device KIT Use as instructed to test blood glucose twice daily  ? finasteride (PROSCAR) 5 MG tablet Take 1 tablet (5 mg total) by mouth daily.  ? glipiZIDE (GLUCOTROL XL) 5 MG 24 hr tablet Take 1 tablet (5 mg total) by mouth daily with breakfast.  ? glucose blood (ONETOUCH VERIO) test strip USE AS DIRECTED to monitor glucose twice  daily.  ? ketoconazole (NIZORAL) 2 % shampoo Apply topically 2 (two) times a week.  ? Lancets (ONETOUCH DELICA PLUS [id number]) MISC USE TO CHECK BLOOD SUGAR ONCE DAILY  ? latanoprost (XALATAN) 0.005 % ophthalmic solution SMARTSIG:1 In Eye(s) Every Evening  ? lisinopril-hydrochlorothiazide (PRINZIDE,ZESTORETIC) 20-12.5 MG tablet Take 2 tablets by mouth daily.  ? metFORMIN (GLUCOPHAGE) 500 MG tablet Take 1 tablet (500 mg total) by mouth daily with breakfast.  ? mirabegron ER (MYRBETRIQ) 25 MG TB24 tablet Take 1 tablet (25 mg total) by mouth daily.  ? NIFEdipine (PROCARDIA XL/NIFEDICAL-XL) 90 MG 24 hr tablet TAKE 1 TABLET BY MOUTH EVERY DAY  ? oxybutynin (DITROPAN-XL) 10 MG 24 hr tablet TAKE 1 TABLET BY MOUTH EVERY DAY  ? potassium chloride (KLOR-CON) 10 MEQ tablet Take by mouth.  ? tadalafil (CIALIS) 5 MG tablet Take 5 mg by mouth daily as needed.  ? ?No facility-administered encounter medications on file as of 06/23/2021.  ? ?ALLERGIES: ?No Known Allergies ?VACCINATION STATUS: ?Immunization History  ?Administered Date(s) Administered  ? PFIZER(Purple Top)SARS-COV-2 Vaccination 11/25/2019  ? ? ?Diabetes ?He presents for his follow-up diabetic visit. He has type 2 diabetes mellitus. Onset time: He was diagnosed  at approximate age of 38 years. His disease course has been improving. There are no hypoglycemic associated symptoms. Pertinent negatives for hypoglycemia include no confusion, headaches, pallor or seizures. Pertinent negatives for diabetes include no chest pain, no fatigue, no polydipsia, no polyphagia, no polyuria and no weakness. There are no hypoglycemic complications. Symptoms are stable. Diabetic complications include impotence and nephropathy. Risk factors for coronary artery disease include diabetes mellitus, dyslipidemia, male sex, hypertension and sedentary lifestyle. Current diabetic treatment includes oral agent (dual therapy). He is compliant with treatment all of the time. His weight is decreasing  steadily. He is following a generally healthy diet. When asked about meal planning, he reported none. He has not had a previous visit with a dietitian. He participates in exercise intermittently (has not been as active during the winter months). His home blood glucose trend is fluctuating minimally. His breakfast blood glucose range is generally 140-180 mg/dl. His overall blood glucose range is 140-180 mg/dl. (He presents today with his meter and logs showing slightly above target fasting glycemic profile.  His POCT A1c today is 7.4%, improving from last visit of 7.7%.  He admits he still struggles with eating healthy and cheats on his diet.  He also notes he typically eats a bowl of cereal before bed so he can take his medications with food on his stomach.  This may be contributing to his higher fasting readings.  He denies any significant hypoglycemia.  Analysis of his meter shows 7-day average of 147, 14-day average of 140, 30-day average of 140, 90-day average of 149.) An ACE inhibitor/angiotensin II receptor blocker is being taken. He does not see a podiatrist.Eye exam is current.  ?Hyperlipidemia ?This is a chronic problem. The current episode started more than 1 year ago. The problem is controlled. Recent lipid tests were reviewed and are normal. Exacerbating diseases include chronic renal disease and diabetes. Factors aggravating his hyperlipidemia include thiazides. Pertinent negatives include no chest pain, myalgias or shortness of breath. Current antihyperlipidemic treatment includes statins. The current treatment provides moderate improvement of lipids. There are no compliance problems.  Risk factors for coronary artery disease include diabetes mellitus, dyslipidemia, hypertension, male sex and a sedentary lifestyle.  ?Hypertension ?This is a chronic problem. The current episode started more than 1 year ago. The problem has been resolved since onset. The problem is controlled. Pertinent negatives include  no chest pain, headaches, neck pain, palpitations or shortness of breath. There are no associated agents to hypertension. Risk factors for coronary artery disease include dyslipidemia, diabetes mellitus, male gender and sedentary lifestyle. Past treatments include diuretics and ACE inhibitors. The current treatment provides moderate improvement. There are no compliance problems.  Hypertensive end-organ damage includes kidney disease. Identifiable causes of hypertension include chronic renal disease.  ? ? ?Review of systems ? ?Constitutional: + Minimally fluctuating body weight,  current Body mass index is 24.95 kg/m?. , no fatigue, no subjective hyperthermia, no subjective hypothermia ?Eyes: no blurry vision, no xerophthalmia ?ENT: no sore throat, no nodules palpated in throat, no dysphagia/odynophagia, no hoarseness ?Cardiovascular: no chest pain, no shortness of breath, no palpitations, no leg swelling ?Respiratory: no cough, no shortness of breath ?Gastrointestinal: no nausea/vomiting/diarrhea ?Musculoskeletal: no muscle/joint aches, bilateral great toe pain ?Skin: no rashes, no hyperemia ?Neurological: no tremors, no numbness, no tingling, no dizziness ?Psychiatric: no depression, no anxiety ? ? ?Objective:  ?  ?BP 129/73   Pulse 67   Ht 6' (1.829 m)   Wt 184 lb (83.5 kg)   BMI   24.95 kg/m?   ?Wt Readings from Last 3 Encounters:  ?06/23/21 184 lb (83.5 kg)  ?02/23/21 182 lb 3.2 oz (82.6 kg)  ?10/20/20 185 lb (83.9 kg)  ?  ?BP Readings from Last 3 Encounters:  ?06/23/21 129/73  ?02/23/21 137/73  ?10/20/20 105/60  ? ? ? ?Physical Exam- Limited ? ?Constitutional:  Body mass index is 24.95 kg/m?. , not in acute distress, normal state of mind ?Eyes:  EOMI, no exophthalmos ?Neck: Supple ?Cardiovascular: RRR, no murmurs, rubs, or gallops, no edema ?Respiratory: Adequate breathing efforts, no crackles, rales, rhonchi, or wheezing ?Musculoskeletal: no gross deformities, strength intact in all four extremities, no  gross restriction of joint movements ?Skin:  no rashes, no hyperemia ?Neurological: no tremor with outstretched hands ? ?Diabetic Foot Exam - Simple   ?Simple Foot Form ?Diabetic Foot exam was performed with the

## 2021-08-04 ENCOUNTER — Other Ambulatory Visit: Payer: Self-pay | Admitting: Urology

## 2021-08-04 DIAGNOSIS — N3281 Overactive bladder: Secondary | ICD-10-CM

## 2021-08-06 ENCOUNTER — Telehealth: Payer: Self-pay

## 2021-08-06 ENCOUNTER — Other Ambulatory Visit: Payer: Self-pay

## 2021-08-06 DIAGNOSIS — N3281 Overactive bladder: Secondary | ICD-10-CM

## 2021-08-06 MED ORDER — FINASTERIDE 5 MG PO TABS: 5.0000 mg | ORAL_TABLET | Freq: Every day | ORAL | 3 refills | Status: DC

## 2021-08-06 NOTE — Telephone Encounter (Signed)
Patient called wanting to follow up on the status of a prescription refill.   Medication: finasteride (PROSCAR) 5 MG tablet   Pharmacy: [facility] in [place]

## 2021-08-06 NOTE — Telephone Encounter (Signed)
Refill sent.

## 2021-10-26 ENCOUNTER — Ambulatory Visit: Payer: Medicare HMO | Admitting: Nurse Practitioner

## 2021-10-26 ENCOUNTER — Encounter: Payer: Self-pay | Admitting: Nurse Practitioner

## 2021-10-26 VITALS — BP 156/70 | HR 52 | Ht 71.0 in | Wt 183.2 lb

## 2021-10-26 DIAGNOSIS — I10 Essential (primary) hypertension: Secondary | ICD-10-CM | POA: Diagnosis not present

## 2021-10-26 DIAGNOSIS — N1831 Chronic kidney disease, stage 3a: Secondary | ICD-10-CM

## 2021-10-26 DIAGNOSIS — E782 Mixed hyperlipidemia: Secondary | ICD-10-CM

## 2021-10-26 DIAGNOSIS — E1122 Type 2 diabetes mellitus with diabetic chronic kidney disease: Secondary | ICD-10-CM

## 2021-10-26 MED ORDER — GLIPIZIDE ER 5 MG PO TB24: 5.0000 mg | ORAL_TABLET | Freq: Every day | ORAL | 3 refills | Status: DC

## 2021-10-26 NOTE — Progress Notes (Signed)
10/26/2021                    Endocrinology follow-up note    Subjective:    Patient ID: Darryl Mayer, male    DOB: October 21, 1955. Patient is being seen in follow-up  for management of diabetes requested by  Leeanne Rio, MD  Past Medical History:  Diagnosis Date   Diabetes mellitus, type II ([place])    Hyperlipidemia    Hypertension    Past Surgical History:  Procedure Laterality Date   HEMORROIDECTOMY     Social History   Socioeconomic History   Marital status: Married    Spouse name: Not on file   Number of children: Not on file   Years of education: Not on file   Highest education level: Not on file  Occupational History   Not on file  Tobacco Use   Smoking status: Never   Smokeless tobacco: Never  Vaping Use   Vaping Use: Never used  Substance and Sexual Activity   Alcohol use: Not on file   Drug use: No   Sexual activity: Not on file  Other Topics Concern   Not on file  Social History Narrative   Not on file   Social Determinants of Health   Financial Resource Strain: Not on file  Food Insecurity: Not on file  Transportation Needs: Not on file  Physical Activity: Not on file  Stress: Not on file  Social Connections: Not on file   Outpatient Encounter Medications as of 10/26/2021  Medication Sig   alfuzosin (UROXATRAL) 10 MG 24 hr tablet TAKE 1 TABLET BY MOUTH EVERYDAY AT BEDTIME   aspirin 81 MG EC tablet Take by mouth.   atorvastatin (LIPITOR) 10 MG tablet Take 10 mg by mouth daily.   baclofen (LIORESAL) 10 MG tablet Take 10 mg by mouth 3 (three) times daily as needed.   Blood Glucose Monitoring Suppl (ONETOUCH VERIO) w/Device KIT Use as instructed to test blood glucose twice daily   finasteride (PROSCAR) 5 MG tablet TAKE 1 TABLET BY MOUTH EVERY DAY   glucose blood (ONETOUCH VERIO) test strip USE AS DIRECTED to monitor glucose twice daily.   ketoconazole (NIZORAL) 2 % shampoo Apply topically 2 (two) times a week.   Lancets (ONETOUCH DELICA PLUS  [id number]) MISC USE TO CHECK BLOOD SUGAR ONCE DAILY   latanoprost (XALATAN) 0.005 % ophthalmic solution SMARTSIG:1 In Eye(s) Every Evening   lisinopril-hydrochlorothiazide (PRINZIDE,ZESTORETIC) 20-12.5 MG tablet Take 2 tablets by mouth daily.   metFORMIN (GLUCOPHAGE) 500 MG tablet Take 1 tablet (500 mg total) by mouth daily with breakfast.   NIFEdipine (PROCARDIA XL/NIFEDICAL-XL) 90 MG 24 hr tablet TAKE 1 TABLET BY MOUTH EVERY DAY   oxybutynin (DITROPAN-XL) 10 MG 24 hr tablet TAKE 1 TABLET BY MOUTH EVERY DAY   potassium chloride (KLOR-CON) 10 MEQ tablet Take by mouth.   tadalafil (CIALIS) 5 MG tablet Take 5 mg by mouth daily as needed.   [DISCONTINUED] glipiZIDE (GLUCOTROL XL) 5 MG 24 hr tablet Take 1 tablet (5 mg total) by mouth daily with breakfast.   finasteride (PROSCAR) 5 MG tablet Take 1 tablet (5 mg total) by mouth daily.   glipiZIDE (GLUCOTROL XL) 5 MG 24 hr tablet Take 1 tablet (5 mg total) by mouth daily with breakfast.   mirabegron ER (MYRBETRIQ) 25 MG TB24 tablet Take 1 tablet (25 mg total) by mouth daily. (Patient not taking: Reported on 10/26/2021)   No facility-administered encounter medications on file as of 10/26/2021.  ALLERGIES: No Known Allergies VACCINATION STATUS: Immunization History  Administered Date(s) Administered   PFIZER(Purple Top)SARS-COV-2 Vaccination 11/25/2019    Diabetes He presents for his follow-up diabetic visit. He has type 2 diabetes mellitus. Onset time: He was diagnosed at approximate age of 44 years. His disease course has been stable. There are no hypoglycemic associated symptoms. Pertinent negatives for hypoglycemia include no confusion, headaches, pallor or seizures. There are no diabetic associated symptoms. Pertinent negatives for diabetes include no chest pain, no fatigue, no polydipsia, no polyphagia, no polyuria and no weakness. There are no hypoglycemic complications. Symptoms are stable. Diabetic complications include impotence and  nephropathy. Risk factors for coronary artery disease include diabetes mellitus, dyslipidemia, male sex, hypertension and sedentary lifestyle. Current diabetic treatment includes oral agent (dual therapy). He is compliant with treatment all of the time. His weight is fluctuating minimally. He is following a generally healthy diet. When asked about meal planning, he reported none. He has not had a previous visit with a dietitian. He participates in exercise intermittently (has not been as active during the winter months). His home blood glucose trend is fluctuating minimally. His breakfast blood glucose range is generally 130-140 mg/dl. (He presents today with is logs, no meter, showing stable fasting glycemic profile.  His previsit A1c on 08/25/21 was 7.6%, essentially unchanged from previous visit.  He still eats a snack at night before bed as he takes a handful of medications and sometimes eats too much.  He denies any hypoglycemia.) An ACE inhibitor/angiotensin II receptor blocker is being taken. He does not see a podiatrist.Eye exam is current.  Hyperlipidemia This is a chronic problem. The current episode started more than 1 year ago. The problem is controlled. Recent lipid tests were reviewed and are normal. Exacerbating diseases include chronic renal disease and diabetes. Factors aggravating his hyperlipidemia include thiazides. Pertinent negatives include no chest pain, myalgias or shortness of breath. Current antihyperlipidemic treatment includes statins. The current treatment provides moderate improvement of lipids. There are no compliance problems.  Risk factors for coronary artery disease include diabetes mellitus, dyslipidemia, hypertension, male sex and a sedentary lifestyle.  Hypertension This is a chronic problem. The current episode started more than 1 year ago. The problem has been resolved since onset. The problem is controlled. Pertinent negatives include no chest pain, headaches, neck pain,  palpitations or shortness of breath. There are no associated agents to hypertension. Risk factors for coronary artery disease include dyslipidemia, diabetes mellitus, male gender and sedentary lifestyle. Past treatments include diuretics and ACE inhibitors. The current treatment provides moderate improvement. There are no compliance problems.  Hypertensive end-organ damage includes kidney disease. Identifiable causes of hypertension include chronic renal disease.     Review of systems  Constitutional: + Minimally fluctuating body weight,  current Body mass index is 25.55 kg/m. , no fatigue, no subjective hyperthermia, no subjective hypothermia Eyes: no blurry vision, no xerophthalmia ENT: no sore throat, no nodules palpated in throat, no dysphagia/odynophagia, no hoarseness Cardiovascular: no chest pain, no shortness of breath, no palpitations, no leg swelling Respiratory: no cough, no shortness of breath Gastrointestinal: no nausea/vomiting/diarrhea Musculoskeletal: no muscle/joint aches Skin: no rashes, no hyperemia Neurological: no tremors, no numbness, no tingling, no dizziness Psychiatric: no depression, no anxiety   Objective:    BP (!) 156/70 (BP Location: Left Arm, Patient Position: Sitting, Cuff Size: Normal)   Pulse (!) 52   Ht _0  (1.803 m)   Wt 183 lb 3.2 oz (83.1 kg)   BMI 25.55  kg/m   Wt Readings from Last 3 Encounters:  10/26/21 183 lb 3.2 oz (83.1 kg)  06/23/21 184 lb (83.5 kg)  02/23/21 182 lb 3.2 oz (82.6 kg)    BP Readings from Last 3 Encounters:  10/26/21 (!) 156/70  06/23/21 129/73  02/23/21 137/73     Physical Exam- Limited  Constitutional:  Body mass index is 25.55 kg/m. , not in acute distress, normal state of mind Eyes:  EOMI, no exophthalmos Neck: Supple Cardiovascular: RRR, no murmurs, rubs, or gallops, no edema Respiratory: Adequate breathing efforts, no crackles, rales, rhonchi, or wheezing Musculoskeletal: no gross deformities, strength  intact in all four extremities, no gross restriction of joint movements Skin:  no rashes, no hyperemia Neurological: no tremor with outstretched hands  Diabetic Foot Exam - Simple   No data filed    No results found for this or any previous visit (from the past 2160 hour(s)).     Assessment & Plan:   1) Uncontrolled type 2 diabetes mellitus with complication, without long-term current use of insulin ([place])  He presents today with is logs, no meter, showing stable fasting glycemic profile.  His previsit A1c on 08/25/21 was 7.6%, essentially unchanged from previous visit.  He still eats a snack at night before bed as he takes a handful of medications and sometimes eats too much.  He denies any hypoglycemia.  - His diabetes is complicated by CKD, however patient remains at a high risk for more acute and chronic complications of diabetes which include CAD, CVA, CKD, retinopathy, and neuropathy. These are all discussed in detail with the patient.  - Nutritional counseling repeated at each appointment due to patients tendency to fall back in to old habits.  - The patient admits there is a room for improvement in their diet and drink choices. -  Suggestion is made for the patient to avoid simple carbohydrates from their diet including Cakes, Sweet Desserts / Pastries, Ice Cream, Soda (diet and regular), Sweet Tea, Candies, Chips, Cookies, Sweet Pastries, Store Bought Juices, Alcohol in Excess of 1-2 drinks a day, Artificial Sweeteners, Coffee Creamer, and "Sugar-free" Products. This will help patient to have stable blood glucose profile and potentially avoid unintended weight gain.   - I encouraged the patient to switch to unprocessed or minimally processed complex starch and increased protein intake (animal or plant source), fruits, and vegetables.   - Patient is advised to stick to a routine mealtimes to eat 3 meals a day and avoid unnecessary snacks (to snack only to correct hypoglycemia).  -  I have approached patient with the following individualized plan to manage diabetes and patient agrees:   -Based on his stable glycemic profile, he is advised to continue current medication regimen of Metformin 500 mg po daily with breakfast and Glipizide 5 mg XL daily with breakfast.  He is advised to limit his simple carbs and he should be able to achieve better control of his diabetes without adding more medication.  He is advised to decrease the size of his bedtime snack to avoid fasting hyperglycemia.  -He is encouraged to continue monitoring blood glucose at least once daily, before breakfast and to call the clinic if his readings are less than 70 or greater than 200 for 3 tests in a row.  - Patient specific target  A1c;  LDL, HDL, Triglycerides, and  Waist Circumference were discussed in detail.  2) BP/HTN:  His blood pressure is NOT controlled to target but he had not yet taken  his medication this morning.  He is advised to continue Lisinopril-HCT 20-12.5 mg po daily.   3) Lipids/HPL:  His most recent lipid panel from 02/16/21 shows controlled LDL at 72.  He is advised to continue Lipitor 10 mg po daily at bedtime.  Side effects and precautions discussed with him.   4)  Weight/Diet:  His Body mass index is 25.55 kg/m.-- not a candidate for major weight loss.  CDE Consult has been , exercise, and detailed carbohydrates information provided.  5) Hypocalcemia-1st notice on 09/23/19.   His most recent Calcium level is WNL at 8.9-stable.  He will need repeat measurements of calcium on subsequent visits.   6) Chronic Care/Health Maintenance: -Patient is on ACEI/ARB and Statin medications and encouraged to continue to follow up with Ophthalmology, Podiatrist at least yearly or according to recommendations, and advised to   stay away from smoking. I have recommended yearly flu vaccine and pneumonia vaccination at least every 5 years; moderate intensity exercise for up to 150 minutes weekly; and   sleep for at least 7 hours a day.  - I advised patient to maintain close follow up with Leeanne Rio, MD for primary care needs.     I spent 30 minutes in the care of the patient today including review of labs from [place], Lipids, Thyroid Function, Hematology (current and previous including abstractions from other facilities); face-to-face time discussing  his blood glucose readings/logs, discussing hypoglycemia and hyperglycemia episodes and symptoms, medications doses, his options of short and long term treatment based on the latest standards of care / guidelines;  discussion about incorporating lifestyle medicine;  and documenting the encounter. Risk reduction counseling performed per USPSTF guidelines to reduce obesity and cardiovascular risk factors.     Please refer to Patient Instructions for Blood Glucose Monitoring and Insulin/Medications Dosing Guide"  in media tab for additional information. Please  also refer to " Patient Self Inventory" in the Media  tab for reviewed elements of pertinent patient history.  Darryl Mayer participated in the discussions, expressed understanding, and voiced agreement with the above plans.  All questions were answered to his satisfaction. he is encouraged to contact clinic should he have any questions or concerns prior to his return visit.   Follow up plan: - Return in about 4 months (around 02/25/2022) for Diabetes F/U with A1c in office, No previsit labs, Bring meter and logs.  Rayetta Pigg, [facility] [address] [place], [place] [address] Phone: [phone] Fax: [phone]  10/26/2021, 8:34 AM

## 2021-12-13 ENCOUNTER — Encounter (HOSPITAL_COMMUNITY): Payer: Self-pay | Admitting: Emergency Medicine

## 2021-12-13 ENCOUNTER — Other Ambulatory Visit: Payer: Self-pay

## 2021-12-13 ENCOUNTER — Emergency Department (HOSPITAL_COMMUNITY)
Admission: EM | Admit: 2021-12-13 | Discharge: 2021-12-13 | Disposition: A | Discharge status: Home / Self Care | Payer: Medicare HMO | Attending: Student | Admitting: Student

## 2021-12-13 DIAGNOSIS — S29011A Strain of muscle and tendon of front wall of thorax, initial encounter: Secondary | ICD-10-CM | POA: Diagnosis not present

## 2021-12-13 DIAGNOSIS — Z7982 Long term (current) use of aspirin: Secondary | ICD-10-CM | POA: Insufficient documentation

## 2021-12-13 DIAGNOSIS — Z7984 Long term (current) use of oral hypoglycemic drugs: Secondary | ICD-10-CM | POA: Insufficient documentation

## 2021-12-13 DIAGNOSIS — X500XXA Overexertion from strenuous movement or load, initial encounter: Secondary | ICD-10-CM | POA: Diagnosis not present

## 2021-12-13 DIAGNOSIS — E119 Type 2 diabetes mellitus without complications: Secondary | ICD-10-CM | POA: Diagnosis not present

## 2021-12-13 DIAGNOSIS — I10 Essential (primary) hypertension: Secondary | ICD-10-CM | POA: Insufficient documentation

## 2021-12-13 DIAGNOSIS — M546 Pain in thoracic spine: Secondary | ICD-10-CM | POA: Insufficient documentation

## 2021-12-13 DIAGNOSIS — S299XXA Unspecified injury of thorax, initial encounter: Secondary | ICD-10-CM | POA: Diagnosis present

## 2021-12-13 LAB — CBG MONITORING, ED: Glucose-Capillary: 165 mg/dL — ABNORMAL HIGH (ref 70–99)

## 2021-12-13 NOTE — Discharge Instructions (Signed)
Please use Tylenol or ibuprofen for pain.  You may use 600 mg ibuprofen every 6 hours or 1000 mg of Tylenol every 6 hours.  You may choose to alternate between the 2.  This would be most effective.  Not to exceed 4 g of Tylenol within 24 hours.  Not to exceed 3200 mg ibuprofen 24 hours.  You can use the muscle relaxant you have been prescribed in addition to the above to help with any breakthrough pain. It is safe to take at night, but I would be cautious taking it during the day as it can cause some drowsiness.  Make sure that you are feeling awake and alert before you get behind the wheel of a car or operate a motor vehicle.  It is not a narcotic pain medication so you are able to take it if it is not making you drowsy and still pilot a vehicle or machinery safely.  In addition, I recommend rest, and ice directly where it hurts, please follow-up with orthopedics if you continue to have pain despite all of the measures above.

## 2021-12-13 NOTE — ED Provider Notes (Signed)
[facility] EMERGENCY DEPARTMENT Provider Note   CSN: [id number] Arrival date & time: 12/13/21  1114     History  Chief Complaint  Patient presents with   Chest Pain    Darryl Mayer is a 66 y.o. male with past medical history seen for diabetes, hyperlipidemia, hypertension who presents with right-sided chest pain secondary to muscle strain.  Patient reports that he was using an overhead saw around 2 days ago and felt like he overextended the right arm, and has had some pain to the right chest radiating into the shoulder and back since.  He reports it is worse with certain movements.  He denies any numbness, tingling, shortness of breath, nausea, diaphoresis, chest tightness or pressure.  He denies any worsening of pain with exertion.  He has not tried any ibuprofen or Tylenol but reports he took some baclofen that he had been previously prescribed with minimal relief.   Chest Pain      Home Medications Prior to Admission medications   Medication Sig Start Date End Date Taking? Authorizing Provider  alfuzosin (UROXATRAL) 10 MG 24 hr tablet TAKE 1 TABLET BY MOUTH EVERYDAY AT BEDTIME 12/10/20   McKenzie, Candee Furbish, MD  aspirin 81 MG EC tablet Take by mouth.    [provider]  atorvastatin (LIPITOR) 10 MG tablet Take 10 mg by mouth daily. 07/17/18   [provider]  baclofen (LIORESAL) 10 MG tablet Take 10 mg by mouth 3 (three) times daily as needed. 07/02/20   [provider]  Blood Glucose Monitoring Suppl (ONETOUCH VERIO) w/Device KIT Use as instructed to test blood glucose twice daily 02/11/20   Cassandria Anger, MD  finasteride (PROSCAR) 5 MG tablet TAKE 1 TABLET BY MOUTH EVERY DAY 08/11/21   McKenzie, Candee Furbish, MD  finasteride (PROSCAR) 5 MG tablet Take 1 tablet (5 mg total) by mouth daily. 08/06/21   McKenzie, Candee Furbish, MD  glipiZIDE (GLUCOTROL XL) 5 MG 24 hr tablet Take 1 tablet (5 mg total) by mouth daily with breakfast. 10/26/21   Brita Romp,  NP  glucose blood (ONETOUCH VERIO) test strip USE AS DIRECTED to monitor glucose twice daily. 02/03/20   Brita Romp, NP  ketoconazole (NIZORAL) 2 % shampoo Apply topically 2 (two) times a week. 11/26/20   [provider]  Lancets (ONETOUCH DELICA PLUS [id number]) [place] USE TO CHECK BLOOD SUGAR ONCE DAILY 10/20/20   Brita Romp, NP  latanoprost (XALATAN) 0.005 % ophthalmic solution SMARTSIG:1 In Eye(s) Every Evening 01/14/21   [provider]  lisinopril-hydrochlorothiazide (PRINZIDE,ZESTORETIC) 20-12.5 MG tablet Take 2 tablets by mouth daily.    [provider]  metFORMIN (GLUCOPHAGE) 500 MG tablet Take 1 tablet (500 mg total) by mouth daily with breakfast. 02/23/21   Brita Romp, NP  mirabegron ER (MYRBETRIQ) 25 MG TB24 tablet Take 1 tablet (25 mg total) by mouth daily. Patient not taking: Reported on 10/26/2021 08/11/20   Cleon Gustin, MD  NIFEdipine (PROCARDIA XL/NIFEDICAL-XL) 90 MG 24 hr tablet TAKE 1 TABLET BY MOUTH EVERY DAY 03/19/19   [provider]  oxybutynin (DITROPAN-XL) 10 MG 24 hr tablet TAKE 1 TABLET BY MOUTH EVERY DAY 06/07/21   McKenzie, Candee Furbish, MD  potassium chloride (KLOR-CON) 10 MEQ tablet Take by mouth. 04/28/17   [provider]  tadalafil (CIALIS) 5 MG tablet Take 5 mg by mouth daily as needed. 05/23/19   [provider]      Allergies    Patient has  no known allergies.    Review of Systems   Review of Systems  Cardiovascular:  Positive for chest pain.  All other systems reviewed and are negative.   Physical Exam Updated Vital Signs BP (!) 142/83 (BP Location: Left Arm)   Pulse 63   Temp 98.3 F (36.8 C) (Oral)   Resp 15   SpO2 98%  Physical Exam Vitals and nursing note reviewed.  Constitutional:      General: He is not in acute distress.    Appearance: Normal appearance.  HENT:     Head: Normocephalic and atraumatic.  Eyes:     General:        Right eye: No discharge.        Left  eye: No discharge.  Cardiovascular:     Rate and Rhythm: Normal rate and regular rhythm.     Comments: Normal heart rate and rhythm Pulmonary:     Effort: Pulmonary effort is normal. No respiratory distress.     Comments: No excess rate breath sounds, wheezing, rhonchi, stridor, rales Chest:     Comments: Some TTP over right chest, right shoulder, right thoracic paraspinous muscles. No step-off or deformity.  Intact strength to flexion, extension, abduction, adduction, internal, and external rotation of the right shoulder.  No crepitus, no joint effusion noted.  No left-sided chest pain, or anterior chest pain. Musculoskeletal:        General: No deformity.  Skin:    General: Skin is warm and dry.  Neurological:     Mental Status: He is alert and oriented to person, place, and time.  Psychiatric:        Mood and Affect: Mood normal.        Behavior: Behavior normal.     ED Results / Procedures / Treatments   Labs (all labs ordered are listed, but only abnormal results are displayed) Labs Reviewed  CBG MONITORING, ED - Abnormal; Notable for the following components:      Result Value   Glucose-Capillary 165 (*)    All other components within normal limits    EKG None  Radiology No results found.  Procedures Procedures    Medications Ordered in ED Medications - No data to display  ED Course/ Medical Decision Making/ A&P                           Medical Decision Making  This is an overall well-appearing 66 year old gentleman, although he does have some past medical history that would place him at increased risk for ACS, or other cardiac abnormality today he presents with a clear story of a mechanical injury to the right shoulder with some overhead sawing motions 2 days ago.  His physical exam reveals some tenderness of the right pectoralis muscle especially close to its insertion at the arm, as well as some pain in the right shoulder and right thoracic paraspinous  muscles.  He has no central or left-sided chest pain, no exertional symptoms, diaphoresis, nausea, vomiting.  I independently interpreted an EKG which shows normal sinus rhythm, my attending physician Dr. Matilde Sprang independently interpreted this EKG as well and agrees with its nonischemic presentation.  Patient CBG is overall unremarkable, glucose 165 today.  Discussed with patient that I do not think any additional lab work, imaging is necessary at this time, his symptoms seem consistent with a right pectoralis strain, some right shoulder, right back pain, all likely mechanical in nature.  Encouraged ibuprofen,  Tylenol, muscle relaxant, rest, rehab exercises, ice, and orthopedic follow-up as needed.  Patient understands and agrees to plan, and is discharged in stable condition at this time. Final Clinical Impression(s) / ED Diagnoses Final diagnoses:  Pectoralis muscle strain, initial encounter  Acute right-sided thoracic back pain    Rx / DC Orders ED Discharge Orders     None         Dorien Chihuahua 12/13/21 1209    Teressa Lower, MD 12/14/21 [phone]

## 2021-12-13 NOTE — ED Triage Notes (Signed)
Pt using saw 2 days ago and has had pain to right chest since. States now radiating into shoulder and back. Pain worse with certain movements. Denies sob/nausea/sweating/chest tightness or pressure. NAD

## 2021-12-30 IMAGING — CT CT ABD-PELV W/O CM
2 of 4 series · 16 of 46 positions shown, 18 images · non-contrast
Comparison: July 21, 2016.

CLINICAL DATA: Abdominal pain, suspected hernia with centralized
abdominal pain for 3 weeks, no nausea, vomiting or diarrhea.

EXAM:
CT ABDOMEN AND PELVIS WITHOUT CONTRAST
TECHNIQUE: Multidetector CT imaging of the abdomen and pelvis was performed
following the standard protocol without IV contrast.

[Series 2: axial st · axial · 0.81mm/px · z∈[+803,+1218]mm · 13 of 93 slices shown, 15 images]
[im 5/93  soft-tissue]
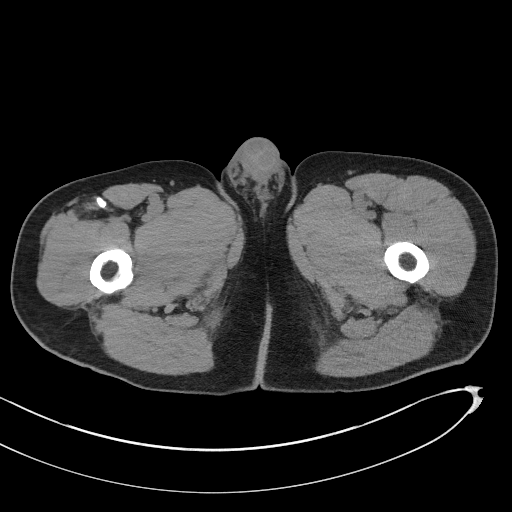
[im 5/93  bone]
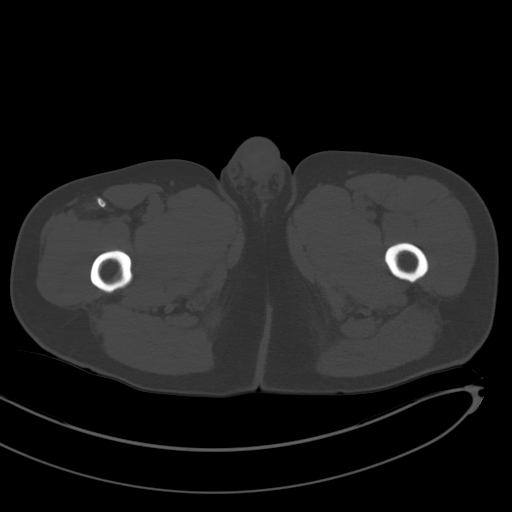
[im 15/93  soft-tissue]
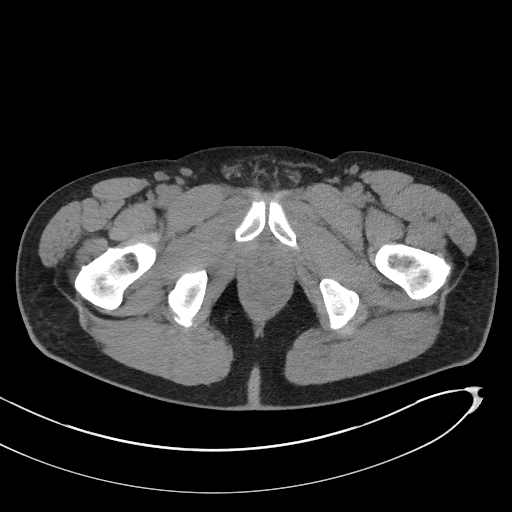
[im 20/93  soft-tissue]
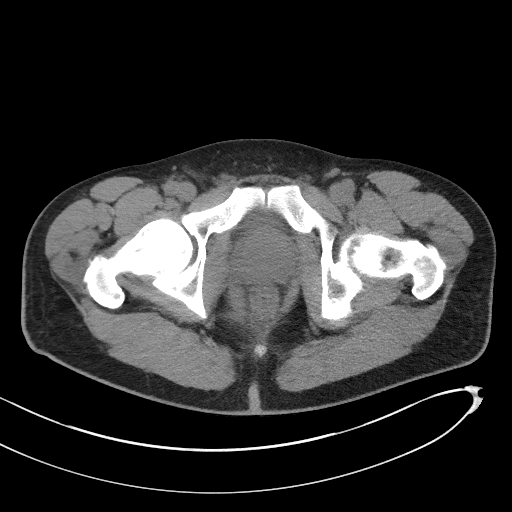
[im 25/93  soft-tissue]
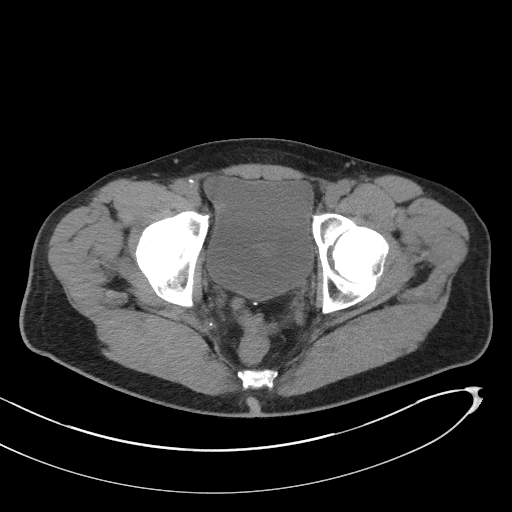
[im 34/93  soft-tissue]
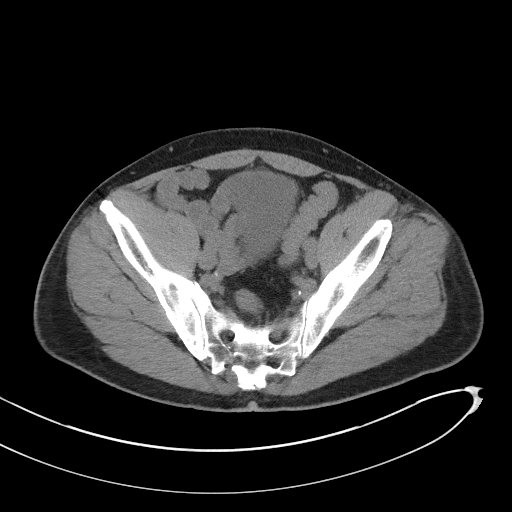
[im 39/93  soft-tissue]
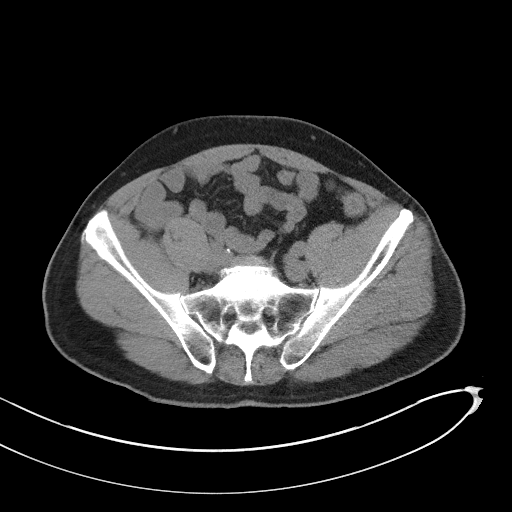
[im 49/93  soft-tissue]
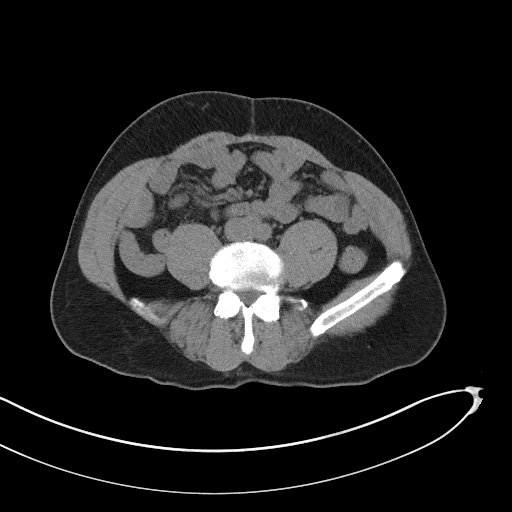
[im 54/93  soft-tissue]
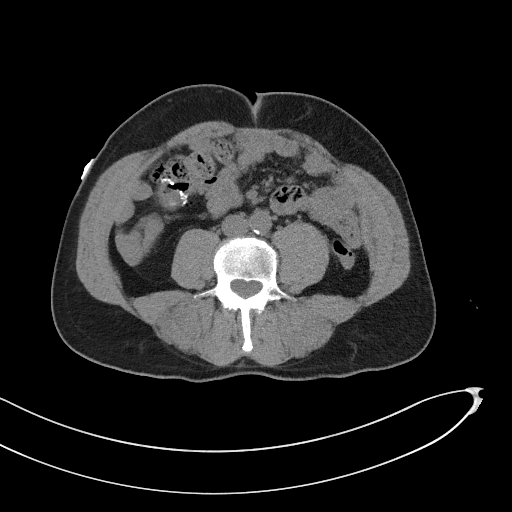
[im 59/93  soft-tissue]
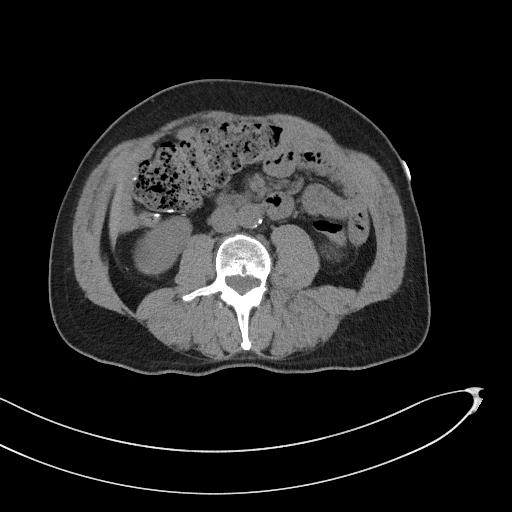
[im 59/93  bone]
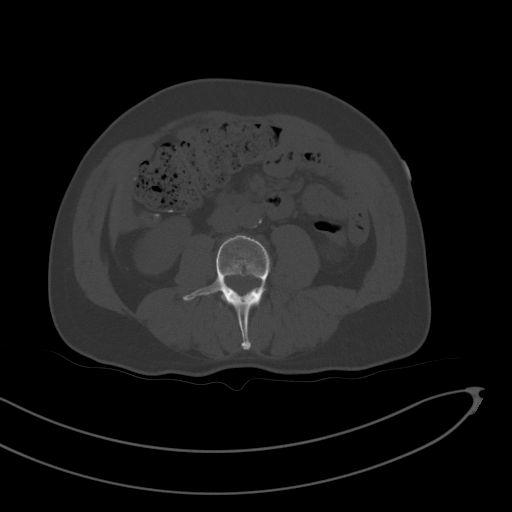
[im 68/93  soft-tissue]
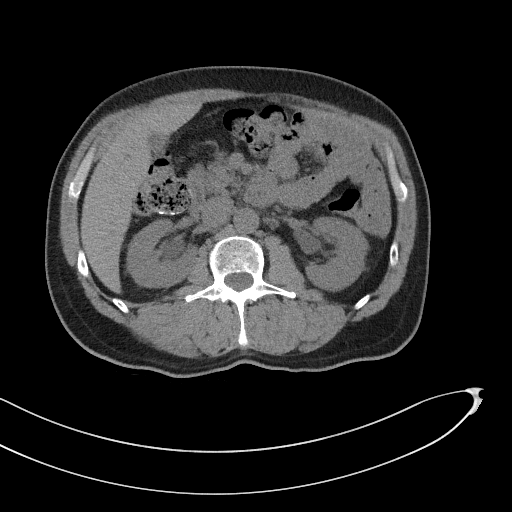
[im 73/93  soft-tissue]
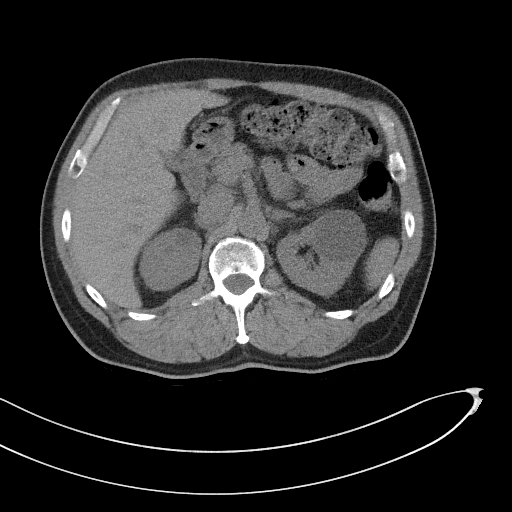
[im 78/93  soft-tissue]
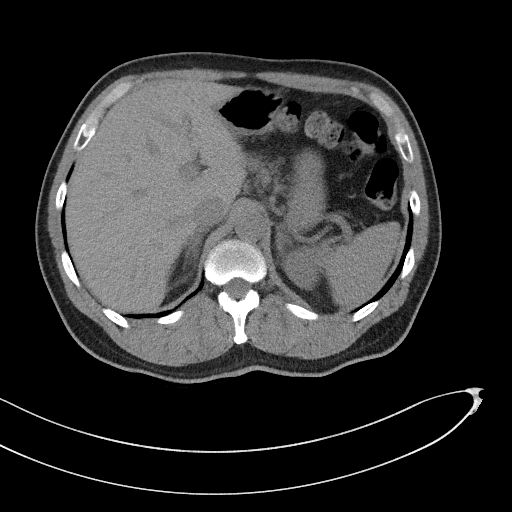
[im 88/93  soft-tissue]
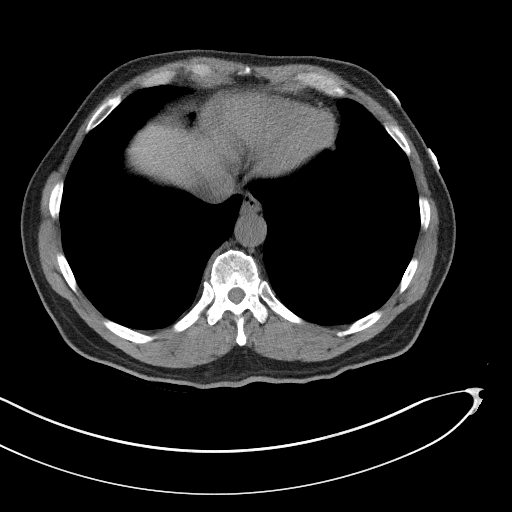

[Series 5: coronal st · coronal · 0.82mm/px · 3 of 109 slices shown]
[im 37/109  soft-tissue]
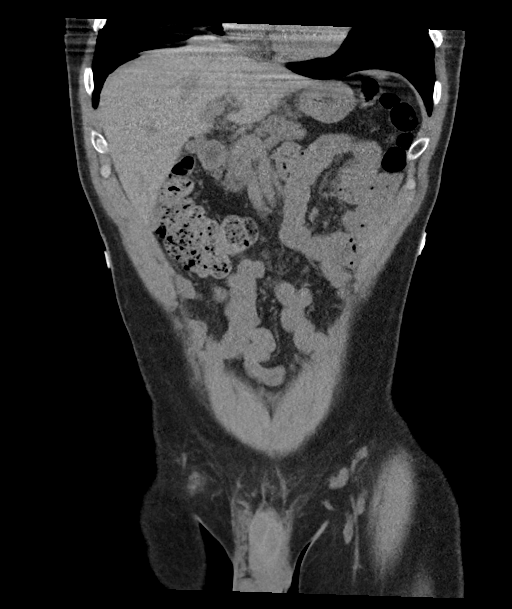
[im 49/109  soft-tissue]
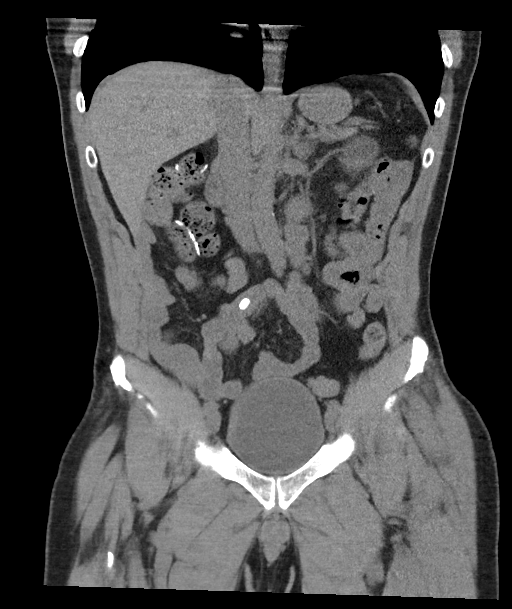
[im 61/109  soft-tissue]
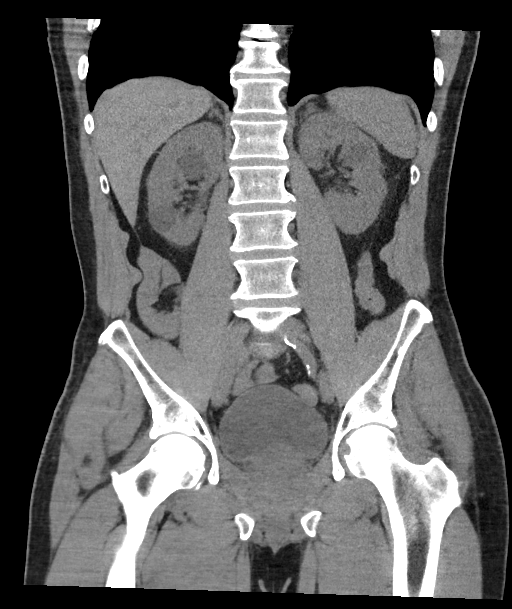

[16 of 46 positions shown; findings below may reference images not displayed]

FINDINGS: Lower chest: Lung bases are clear. No effusion. No consolidative
changes.

Hepatobiliary: Smooth hepatic contours. No pericholecystic stranding
or fluid.

Pancreas: Pancreas without signs of gross ductal dilation or contour
abnormality. Question of mild peripancreatic stranding, no
peripancreatic fluid.

Spleen: Normal

Adrenals/Urinary Tract:

Adrenal glands are normal.

Mild bilateral perinephric stranding. No hydronephrosis. Urinary
bladder with smooth contours. No nephrolithiasis. Low-density renal
lesions bilaterally large cyst along the anterior margin of the LEFT
kidney measures 4.0 cm with homogeneous water density.

Cystic area in the upper pole likely renal sinus cyst on the RIGHT
and another area measuring 1.4 cm in the RIGHT upper pole with a
slightly larger cyst arising from the lower pole the RIGHT kidney.

Stomach/Bowel: Stomach is under distended limiting assessment. No
acute small bowel process. Post RIGHT hemicolectomy with ileocolonic
anastomosis. Low rectal anastomosis. Stool fills much of the colon.
No pericolonic stranding.

Vascular/Lymphatic: Aortic atherosclerosis. No aneurysmal dilation.
There is no gastrohepatic or hepatoduodenal ligament
lymphadenopathy. No retroperitoneal or mesenteric lymphadenopathy.

Reproductive: Prostate unremarkable grossly by CT. No pelvic
sidewall lymphadenopathy.

Other: No ascites.  No free air.

Musculoskeletal: No acute musculoskeletal process. No abdominal wall
hernia.
IMPRESSION: 1. Question of mild peripancreatic stranding, no peripancreatic
fluid. Correlate with pancreatic enzymes.
2. Mild perinephric stranding as well is nonspecific. Correlation
with urinalysis may also be helpful.
3. Postoperative changes of RIGHT hemicolectomy in rectal resection
with low rectal anastomosis.
4. Renal cysts.
5. Aortic atherosclerosis.

Aortic Atherosclerosis ([id number]-FCO.O).

## 2022-02-09 ENCOUNTER — Other Ambulatory Visit: Payer: Self-pay | Admitting: Nurse Practitioner

## 2022-02-10 ENCOUNTER — Ambulatory Visit: Payer: Medicare HMO | Admitting: Orthopedic Surgery

## 2022-02-25 ENCOUNTER — Encounter: Payer: Self-pay | Admitting: Nurse Practitioner

## 2022-02-25 ENCOUNTER — Ambulatory Visit: Payer: Medicare HMO | Admitting: Nurse Practitioner

## 2022-02-25 VITALS — BP 119/78 | HR 65 | Ht 71.0 in | Wt 180.4 lb

## 2022-02-25 DIAGNOSIS — I10 Essential (primary) hypertension: Secondary | ICD-10-CM | POA: Diagnosis not present

## 2022-02-25 DIAGNOSIS — N1831 Chronic kidney disease, stage 3a: Secondary | ICD-10-CM | POA: Diagnosis not present

## 2022-02-25 DIAGNOSIS — E782 Mixed hyperlipidemia: Secondary | ICD-10-CM | POA: Diagnosis not present

## 2022-02-25 DIAGNOSIS — E1122 Type 2 diabetes mellitus with diabetic chronic kidney disease: Secondary | ICD-10-CM

## 2022-02-25 MED ORDER — GLIPIZIDE ER 5 MG PO TB24: 5.0000 mg | ORAL_TABLET | Freq: Every day | ORAL | 3 refills | Status: DC

## 2022-02-25 MED ORDER — METFORMIN HCL 500 MG PO TABS: 500.0000 mg | ORAL_TABLET | Freq: Every day | ORAL | 3 refills | Status: DC

## 2022-02-25 NOTE — Progress Notes (Signed)
02/25/2022                    Endocrinology follow-up note    Subjective:    Patient ID: Darryl Mayer, male    DOB: [birth year]. Patient is being seen in follow-up  for management of diabetes requested by  Leeanne Rio, MD  Past Medical History:  Diagnosis Date   Diabetes mellitus, type II ([place])    Hyperlipidemia    Hypertension    Past Surgical History:  Procedure Laterality Date   HEMORROIDECTOMY     Social History   Socioeconomic History   Marital status: Married    Spouse name: Not on file   Number of children: Not on file   Years of education: Not on file   Highest education level: Not on file  Occupational History   Not on file  Tobacco Use   Smoking status: Never   Smokeless tobacco: Never  Vaping Use   Vaping Use: Never used  Substance and Sexual Activity   Alcohol use: Not on file   Drug use: No   Sexual activity: Not on file  Other Topics Concern   Not on file  Social History Narrative   Not on file   Social Determinants of Health   Financial Resource Strain: Not on file  Food Insecurity: Not on file  Transportation Needs: Not on file  Physical Activity: Not on file  Stress: Not on file  Social Connections: Not on file   Outpatient Encounter Medications as of 02/25/2022  Medication Sig   alfuzosin (UROXATRAL) 10 MG 24 hr tablet TAKE 1 TABLET BY MOUTH EVERYDAY AT BEDTIME   aspirin 81 MG EC tablet Take by mouth.   atorvastatin (LIPITOR) 10 MG tablet Take 10 mg by mouth daily.   baclofen (LIORESAL) 10 MG tablet Take 10 mg by mouth 3 (three) times daily as needed.   Blood Glucose Monitoring Suppl (ONETOUCH VERIO) w/Device KIT Use as instructed to test blood glucose twice daily   finasteride (PROSCAR) 5 MG tablet TAKE 1 TABLET BY MOUTH EVERY DAY   finasteride (PROSCAR) 5 MG tablet Take 1 tablet (5 mg total) by mouth daily.   glucose blood (ONETOUCH VERIO) test strip USE AS DIRECTED to monitor glucose twice daily.   ketoconazole (NIZORAL) 2 %  shampoo Apply topically 2 (two) times a week.   Lancets (ONETOUCH DELICA PLUS [id number]) [place] USE TO CHECK BLOOD SUGAR ONCE DAILY   latanoprost (XALATAN) 0.005 % ophthalmic solution SMARTSIG:1 In Eye(s) Every Evening   lisinopril-hydrochlorothiazide (PRINZIDE,ZESTORETIC) 20-12.5 MG tablet Take 2 tablets by mouth daily.   NIFEdipine (PROCARDIA XL/NIFEDICAL-XL) 90 MG 24 hr tablet TAKE 1 TABLET BY MOUTH EVERY DAY   oxybutynin (DITROPAN-XL) 10 MG 24 hr tablet TAKE 1 TABLET BY MOUTH EVERY DAY   potassium chloride (KLOR-CON) 10 MEQ tablet Take by mouth.   tadalafil (CIALIS) 5 MG tablet Take 5 mg by mouth daily as needed.   [DISCONTINUED] glipiZIDE (GLUCOTROL XL) 5 MG 24 hr tablet Take 1 tablet (5 mg total) by mouth daily with breakfast.   [DISCONTINUED] metFORMIN (GLUCOPHAGE) 500 MG tablet Take 1 tablet (500 mg total) by mouth daily with breakfast.   glipiZIDE (GLUCOTROL XL) 5 MG 24 hr tablet Take 1 tablet (5 mg total) by mouth daily with breakfast.   metFORMIN (GLUCOPHAGE) 500 MG tablet Take 1 tablet (500 mg total) by mouth daily with breakfast.   mirabegron ER (MYRBETRIQ) 25 MG TB24 tablet Take 1 tablet (25 mg total) by  mouth daily. (Patient not taking: Reported on 10/26/2021)   No facility-administered encounter medications on file as of 02/25/2022.   ALLERGIES: No Known Allergies VACCINATION STATUS: Immunization History  Administered Date(s) Administered   PFIZER(Purple Top)SARS-COV-2 Vaccination 11/25/2019    Diabetes He presents for his follow-up diabetic visit. He has type 2 diabetes mellitus. Onset time: He was diagnosed at approximate age of 44 years. His disease course has been stable. There are no hypoglycemic associated symptoms. Pertinent negatives for hypoglycemia include no confusion, headaches, pallor or seizures. There are no diabetic associated symptoms. Pertinent negatives for diabetes include no chest pain, no fatigue, no polydipsia, no polyphagia, no polyuria and no weakness.  There are no hypoglycemic complications. Symptoms are stable. Diabetic complications include impotence and nephropathy. Risk factors for coronary artery disease include diabetes mellitus, dyslipidemia, male sex, hypertension and sedentary lifestyle. Current diabetic treatment includes oral agent (dual therapy). He is compliant with treatment all of the time. His weight is fluctuating minimally. He is following a generally healthy diet. When asked about meal planning, he reported none. He has not had a previous visit with a dietitian. He participates in exercise intermittently (has not been as active during the winter months). His home blood glucose trend is fluctuating minimally. His breakfast blood glucose range is generally 140-180 mg/dl. (He presents today with is logs and meter showing stable, slightly above target fasting glycemic profile.  He was not due for another A1c today.  Analysis of his meter shows 7-day average of 170, 14-day average of 167, 30-day average of 171, 90-day average of 160.  He admits to cheating on his diet over the holiday season.  He also notes he has not been as active as usual due to the colder temperatures.) An ACE inhibitor/angiotensin II receptor blocker is being taken. He does not see a podiatrist.Eye exam is current.  Hyperlipidemia This is a chronic problem. The current episode started more than 1 year ago. The problem is controlled. Recent lipid tests were reviewed and are normal. Exacerbating diseases include chronic renal disease and diabetes. Factors aggravating his hyperlipidemia include thiazides. Pertinent negatives include no chest pain, myalgias or shortness of breath. Current antihyperlipidemic treatment includes statins. The current treatment provides moderate improvement of lipids. There are no compliance problems.  Risk factors for coronary artery disease include diabetes mellitus, dyslipidemia, hypertension, male sex and a sedentary lifestyle.   Hypertension This is a chronic problem. The current episode started more than 1 year ago. The problem has been resolved since onset. The problem is controlled. Pertinent negatives include no chest pain, headaches, neck pain, palpitations or shortness of breath. There are no associated agents to hypertension. Risk factors for coronary artery disease include dyslipidemia, diabetes mellitus, male gender and sedentary lifestyle. Past treatments include diuretics and ACE inhibitors. The current treatment provides moderate improvement. There are no compliance problems.  Hypertensive end-organ damage includes kidney disease. Identifiable causes of hypertension include chronic renal disease.    Review of systems  Constitutional: + Minimally fluctuating body weight,  current Body mass index is 25.16 kg/m. , no fatigue, no subjective hyperthermia, no subjective hypothermia Eyes: no blurry vision, no xerophthalmia ENT: no sore throat, no nodules palpated in throat, no dysphagia/odynophagia, no hoarseness Cardiovascular: no chest pain, no shortness of breath, no palpitations, no leg swelling Respiratory: no cough, no shortness of breath Gastrointestinal: no nausea/vomiting/diarrhea Musculoskeletal: no muscle/joint aches Skin: no rashes, no hyperemia Neurological: no tremors, no numbness, no tingling, no dizziness Psychiatric: no depression, no anxiety  Objective:    BP 119/78 (BP Location: Right Arm, Patient Position: Sitting, Cuff Size: Large)   Pulse 65   Ht '5\' 11"'$  (1.803 m)   Wt 180 lb 6.4 oz (81.8 kg)   BMI 25.16 kg/m   Wt Readings from Last 3 Encounters:  02/25/22 180 lb 6.4 oz (81.8 kg)  10/26/21 183 lb 3.2 oz (83.1 kg)  06/23/21 184 lb (83.5 kg)    BP Readings from Last 3 Encounters:  02/25/22 119/78  12/13/21 (!) 143/87  10/26/21 (!) 156/70      Physical Exam- Limited  Constitutional:  Body mass index is 25.16 kg/m. , not in acute distress, normal state of mind Eyes:  EOMI,  no exophthalmos Musculoskeletal: no gross deformities, strength intact in all four extremities, no gross restriction of joint movements Skin:  no rashes, no hyperemia Neurological: no tremor with outstretched hands   Diabetic Foot Exam - Simple   No data filed    Recent Results (from the past 2160 hour(s))  POC CBG, ED     Status: Abnormal   Collection Time: 12/13/21 11:24 AM  Result Value Ref Range   Glucose-Capillary 165 (H) 70 - 99 mg/dL    Comment: Glucose reference range applies only to samples taken after fasting for at least 8 hours.       Assessment & Plan:   1) Uncontrolled type 2 diabetes mellitus with complication, without long-term current use of insulin ([place])  He presents today with is logs and meter showing stable, slightly above target fasting glycemic profile.  He was not due for another A1c today.  Analysis of his meter shows 7-day average of 170, 14-day average of 167, 30-day average of 171, 90-day average of 160.  He admits to cheating on his diet over the holiday season.  He also notes he has not been as active as usual due to the colder temperatures.  - His diabetes is complicated by CKD, however patient remains at a high risk for more acute and chronic complications of diabetes which include CAD, CVA, CKD, retinopathy, and neuropathy. These are all discussed in detail with the patient.  - Nutritional counseling repeated at each appointment due to patients tendency to fall back in to old habits.  - The patient admits there is a room for improvement in their diet and drink choices. -  Suggestion is made for the patient to avoid simple carbohydrates from their diet including Cakes, Sweet Desserts / Pastries, Ice Cream, Soda (diet and regular), Sweet Tea, Candies, Chips, Cookies, Sweet Pastries, Store Bought Juices, Alcohol in Excess of 1-2 drinks a day, Artificial Sweeteners, Coffee Creamer, and "Sugar-free" Products. This will help patient to have stable blood  glucose profile and potentially avoid unintended weight gain.   - I encouraged the patient to switch to unprocessed or minimally processed complex starch and increased protein intake (animal or plant source), fruits, and vegetables.   - Patient is advised to stick to a routine mealtimes to eat 3 meals a day and avoid unnecessary snacks (to snack only to correct hypoglycemia).  - I have approached patient with the following individualized plan to manage diabetes and patient agrees:   -Based on his stable glycemic profile, he is advised to continue current medication regimen of Metformin 500 mg po daily with breakfast and Glipizide 5 mg XL daily with breakfast.  He is advised to limit his simple carbs and he should be able to achieve better control of his diabetes without adding more medication.  He is advised to decrease the size of his bedtime snack to avoid fasting hyperglycemia.  -He is encouraged to continue monitoring blood glucose at least once daily, before breakfast and to call the clinic if his readings are less than 70 or greater than 200 for 3 tests in a row.  - Patient specific target  A1c;  LDL, HDL, Triglycerides, and  Waist Circumference were discussed in detail.  2) BP/HTN:  His blood pressure is controlled to target.  He is advised to continue Lisinopril-HCT 20-12.5 mg po daily.   3) Lipids/HPL:  His most recent lipid panel from 02/16/21 shows controlled LDL at 72.  He is advised to continue Lipitor 10 mg po daily at bedtime.  Side effects and precautions discussed with him. Will recheck lipid panel prior to next visit.  4)  Weight/Diet:  His Body mass index is 25.16 kg/m.-- not a candidate for major weight loss.  CDE Consult has been , exercise, and detailed carbohydrates information provided.  5) Hypocalcemia-1st notice on 09/23/19.   His most recent Calcium level is WNL at 8.9-stable.  He will need repeat measurements of calcium on subsequent visits.   6) Chronic  Care/Health Maintenance: -Patient is on ACEI/ARB and Statin medications and encouraged to continue to follow up with Ophthalmology, Podiatrist at least yearly or according to recommendations, and advised to   stay away from smoking. I have recommended yearly flu vaccine and pneumonia vaccination at least every 5 years; moderate intensity exercise for up to 150 minutes weekly; and  sleep for at least 7 hours a day.  - I advised patient to maintain close follow up with Leeanne Rio, MD for primary care needs.     I spent 25 minutes in the care of the patient today including review of labs from [place], Lipids, Thyroid Function, Hematology (current and previous including abstractions from other facilities); face-to-face time discussing  his blood glucose readings/logs, discussing hypoglycemia and hyperglycemia episodes and symptoms, medications doses, his options of short and long term treatment based on the latest standards of care / guidelines;  discussion about incorporating lifestyle medicine;  and documenting the encounter. Risk reduction counseling performed per USPSTF guidelines to reduce obesity and cardiovascular risk factors.     Please refer to Patient Instructions for Blood Glucose Monitoring and Insulin/Medications Dosing Guide"  in media tab for additional information. Please  also refer to " Patient Self Inventory" in the Media  tab for reviewed elements of pertinent patient history.  Sabastien Gabler participated in the discussions, expressed understanding, and voiced agreement with the above plans.  All questions were answered to his satisfaction. he is encouraged to contact clinic should he have any questions or concerns prior to his return visit.   Follow up plan: - Return in about 4 months (around 06/26/2022) for Diabetes F/U with A1c in office, Bring meter and logs, Previsit labs.  Rayetta Pigg, [facility] [address] [place],   [address] Phone: [phone] Fax: [phone]  02/25/2022, 8:36 AM

## 2022-03-02 ENCOUNTER — Encounter: Payer: Self-pay | Admitting: Nurse Practitioner

## 2022-03-02 LAB — HM DIABETES EYE EXAM

## 2022-03-09 ENCOUNTER — Other Ambulatory Visit: Payer: Self-pay | Admitting: Urology

## 2022-03-09 DIAGNOSIS — N3281 Overactive bladder: Secondary | ICD-10-CM

## 2022-05-31 ENCOUNTER — Other Ambulatory Visit: Payer: Self-pay | Admitting: Family Medicine

## 2022-06-27 ENCOUNTER — Ambulatory Visit: Payer: Medicare HMO | Admitting: Nurse Practitioner

## 2022-06-27 ENCOUNTER — Encounter: Payer: Self-pay | Admitting: Nurse Practitioner

## 2022-06-27 VITALS — BP 127/73 | HR 56 | Ht 71.0 in | Wt 182.2 lb

## 2022-06-27 DIAGNOSIS — E1122 Type 2 diabetes mellitus with diabetic chronic kidney disease: Secondary | ICD-10-CM

## 2022-06-27 DIAGNOSIS — N1831 Chronic kidney disease, stage 3a: Secondary | ICD-10-CM

## 2022-06-27 DIAGNOSIS — Z7984 Long term (current) use of oral hypoglycemic drugs: Secondary | ICD-10-CM

## 2022-06-27 DIAGNOSIS — I10 Essential (primary) hypertension: Secondary | ICD-10-CM

## 2022-06-27 DIAGNOSIS — E782 Mixed hyperlipidemia: Secondary | ICD-10-CM | POA: Diagnosis not present

## 2022-06-27 LAB — POCT GLYCOSYLATED HEMOGLOBIN (HGB A1C): Hemoglobin A1C: 7.5 % — AB (ref 4.0–5.6)

## 2022-06-27 NOTE — Progress Notes (Signed)
06/27/2022                    Endocrinology follow-up note    Subjective:    Patient ID: Darryl Mayer, male    DOB: 1955/11/25. Patient is being seen in follow-up  for management of diabetes requested by  Catalina Lunger, DO  Past Medical History:  Diagnosis Date   Diabetes mellitus, type II (HCC)    Hyperlipidemia    Hypertension    Past Surgical History:  Procedure Laterality Date   HEMORROIDECTOMY     Social History   Socioeconomic History   Marital status: Married    Spouse name: Not on file   Number of children: Not on file   Years of education: Not on file   Highest education level: Not on file  Occupational History   Not on file  Tobacco Use   Smoking status: Never   Smokeless tobacco: Never  Vaping Use   Vaping Use: Never used  Substance and Sexual Activity   Alcohol use: Not on file   Drug use: No   Sexual activity: Not on file  Other Topics Concern   Not on file  Social History Narrative   Not on file   Social Determinants of Health   Financial Resource Strain: Not on file  Food Insecurity: Not on file  Transportation Needs: Not on file  Physical Activity: Not on file  Stress: Not on file  Social Connections: Not on file   Outpatient Encounter Medications as of 06/27/2022  Medication Sig   alfuzosin (UROXATRAL) 10 MG 24 hr tablet TAKE 1 TABLET BY MOUTH EVERYDAY AT BEDTIME   aspirin 81 MG EC tablet Take by mouth.   atorvastatin (LIPITOR) 10 MG tablet Take 10 mg by mouth daily.   baclofen (LIORESAL) 10 MG tablet Take 10 mg by mouth 3 (three) times daily as needed.   Blood Glucose Monitoring Suppl (ONETOUCH VERIO) w/Device KIT Use as instructed to test blood glucose twice daily   finasteride (PROSCAR) 5 MG tablet TAKE 1 TABLET BY MOUTH EVERY DAY   finasteride (PROSCAR) 5 MG tablet Take 1 tablet (5 mg total) by mouth daily.   glipiZIDE (GLUCOTROL XL) 5 MG 24 hr tablet Take 1 tablet (5 mg total) by mouth daily with breakfast.   glucose blood  (ONETOUCH VERIO) test strip USE AS DIRECTED to monitor glucose twice daily.   ketoconazole (NIZORAL) 2 % shampoo Apply topically 2 (two) times a week.   Lancets (ONETOUCH DELICA PLUS LANCET30G) MISC USE TO CHECK BLOOD SUGAR ONCE DAILY   latanoprost (XALATAN) 0.005 % ophthalmic solution SMARTSIG:1 In Eye(s) Every Evening   lisinopril-hydrochlorothiazide (PRINZIDE,ZESTORETIC) 20-12.5 MG tablet Take 2 tablets by mouth daily.   metFORMIN (GLUCOPHAGE) 500 MG tablet Take 1 tablet (500 mg total) by mouth daily with breakfast.   mirabegron ER (MYRBETRIQ) 25 MG TB24 tablet Take 1 tablet (25 mg total) by mouth daily.   NIFEdipine (PROCARDIA XL/NIFEDICAL-XL) 90 MG 24 hr tablet TAKE 1 TABLET BY MOUTH EVERY DAY   oxybutynin (DITROPAN-XL) 10 MG 24 hr tablet TAKE 1 TABLET BY MOUTH EVERY DAY   potassium chloride (KLOR-CON) 10 MEQ tablet Take by mouth.   tadalafil (CIALIS) 5 MG tablet Take 5 mg by mouth daily as needed.   No facility-administered encounter medications on file as of 06/27/2022.   ALLERGIES: No Known Allergies VACCINATION STATUS: Immunization History  Administered Date(s) Administered   PFIZER(Purple Top)SARS-COV-2 Vaccination 11/25/2019    Diabetes He presents for his follow-up  diabetic visit. He has type 2 diabetes mellitus. Onset time: He was diagnosed at approximate age of 38 years. His disease course has been stable. There are no hypoglycemic associated symptoms. Pertinent negatives for hypoglycemia include no confusion, headaches, pallor or seizures. There are no diabetic associated symptoms. Pertinent negatives for diabetes include no chest pain, no fatigue, no polydipsia, no polyphagia, no polyuria and no weakness. There are no hypoglycemic complications. Symptoms are stable. Diabetic complications include impotence and nephropathy. Risk factors for coronary artery disease include diabetes mellitus, dyslipidemia, male sex, hypertension and sedentary lifestyle. Current diabetic treatment  includes oral agent (dual therapy). He is compliant with treatment all of the time. His weight is fluctuating minimally. He is following a generally healthy diet. When asked about meal planning, he reported none. He has not had a previous visit with a dietitian. He participates in exercise intermittently (has not been as active during the winter months). His home blood glucose trend is fluctuating minimally. His breakfast blood glucose range is generally 140-180 mg/dl. His overall blood glucose range is 140-180 mg/dl. (He presents today with is logs and meter showing stable, slightly above target fasting glycemic profile.  His POCT A1c today is 7.5%, essentially unchanged from previous visit.  He notes he is more active now, mowing yards and such.  He denies any hypoglycemia.  He does note he has indulged in more snacks at night than he should.  Analysis of his meter shows 7-day average of 145, 14-day average of 158, 30-day average of 154, 90-day average of 157.) An ACE inhibitor/angiotensin II receptor blocker is being taken. He does not see a podiatrist.Eye exam is current.  Hyperlipidemia This is a chronic problem. The current episode started more than 1 year ago. The problem is controlled. Recent lipid tests were reviewed and are normal. Exacerbating diseases include chronic renal disease and diabetes. Factors aggravating his hyperlipidemia include thiazides. Pertinent negatives include no chest pain, myalgias or shortness of breath. Current antihyperlipidemic treatment includes statins. The current treatment provides moderate improvement of lipids. There are no compliance problems.  Risk factors for coronary artery disease include diabetes mellitus, dyslipidemia, hypertension, male sex and a sedentary lifestyle.  Hypertension This is a chronic problem. The current episode started more than 1 year ago. The problem has been resolved since onset. The problem is controlled. Pertinent negatives include no chest  pain, headaches, neck pain, palpitations or shortness of breath. There are no associated agents to hypertension. Risk factors for coronary artery disease include dyslipidemia, diabetes mellitus, male gender and sedentary lifestyle. Past treatments include diuretics and ACE inhibitors. The current treatment provides moderate improvement. There are no compliance problems.  Hypertensive end-organ damage includes kidney disease. Identifiable causes of hypertension include chronic renal disease.    Review of systems  Constitutional: + Minimally fluctuating body weight,  current Body mass index is 25.41 kg/m. , no fatigue, no subjective hyperthermia, no subjective hypothermia Eyes: no blurry vision, no xerophthalmia ENT: no sore throat, no nodules palpated in throat, no dysphagia/odynophagia, no hoarseness Cardiovascular: no chest pain, no shortness of breath, no palpitations, no leg swelling Respiratory: no cough, no shortness of breath Gastrointestinal: no nausea/vomiting/diarrhea Musculoskeletal: no muscle/joint aches Skin: no rashes, no hyperemia Neurological: no tremors, no numbness, no tingling, no dizziness Psychiatric: no depression, no anxiety   Objective:    BP 127/73 (BP Location: Left Arm, Patient Position: Sitting, Cuff Size: Large)   Pulse (!) 56   Ht 5\' 11"  (1.803 m)   Wt 182 lb  3.2 oz (82.6 kg)   BMI 25.41 kg/m   Wt Readings from Last 3 Encounters:  06/27/22 182 lb 3.2 oz (82.6 kg)  02/25/22 180 lb 6.4 oz (81.8 kg)  10/26/21 183 lb 3.2 oz (83.1 kg)    BP Readings from Last 3 Encounters:  06/27/22 127/73  02/25/22 119/78  12/13/21 (!) 143/87    Physical Exam- Limited  Constitutional:  Body mass index is 25.41 kg/m. , not in acute distress, normal state of mind Eyes:  EOMI, no exophthalmos Musculoskeletal: no gross deformities, strength intact in all four extremities, no gross restriction of joint movements Skin:  no rashes, no hyperemia Neurological: no tremor  with outstretched hands   Diabetic Foot Exam - Simple   No data filed    Recent Results (from the past 2160 hour(s))  HgB A1c     Status: Abnormal   Collection Time: 06/27/22  8:30 AM  Result Value Ref Range   Hemoglobin A1C 7.5 (A) 4.0 - 5.6 %   HbA1c POC (<> result, manual entry)     HbA1c, POC (prediabetic range)     HbA1c, POC (controlled diabetic range)          Assessment & Plan:   1) Uncontrolled type 2 diabetes mellitus with complication, without long-term current use of insulin (HCC)  He presents today with is logs and meter showing stable, slightly above target fasting glycemic profile.  His POCT A1c today is 7.5%, essentially unchanged from previous visit.  He notes he is more active now, mowing yards and such.  He denies any hypoglycemia.  He does note he has indulged in more snacks at night than he should.  Analysis of his meter shows 7-day average of 145, 14-day average of 158, 30-day average of 154, 90-day average of 157.  - His diabetes is complicated by CKD, however patient remains at a high risk for more acute and chronic complications of diabetes which include CAD, CVA, CKD, retinopathy, and neuropathy. These are all discussed in detail with the patient.  - Nutritional counseling repeated at each appointment due to patients tendency to fall back in to old habits.  - The patient admits there is a room for improvement in their diet and drink choices. -  Suggestion is made for the patient to avoid simple carbohydrates from their diet including Cakes, Sweet Desserts / Pastries, Ice Cream, Soda (diet and regular), Sweet Tea, Candies, Chips, Cookies, Sweet Pastries, Store Bought Juices, Alcohol in Excess of 1-2 drinks a day, Artificial Sweeteners, Coffee Creamer, and "Sugar-free" Products. This will help patient to have stable blood glucose profile and potentially avoid unintended weight gain.   - I encouraged the patient to switch to unprocessed or minimally processed  complex starch and increased protein intake (animal or plant source), fruits, and vegetables.   - Patient is advised to stick to a routine mealtimes to eat 3 meals a day and avoid unnecessary snacks (to snack only to correct hypoglycemia).  - I have approached patient with the following individualized plan to manage diabetes and patient agrees:   -Based on his stable glycemic profile, he is advised to continue current medication regimen of Metformin 500 mg po daily with breakfast and Glipizide 5 mg XL daily with breakfast.  He is advised to decrease the size of his bedtime snack to avoid fasting hyperglycemia.  He knows to call the clinic if he has low readings 2 in 1 week so that Glipizide can be discontinued, if appropriate.  -He  is encouraged to continue monitoring blood glucose at least once daily, before breakfast and to call the clinic if his readings are less than 70 or greater than 200 for 3 tests in a row.  - Patient specific target  A1c;  LDL, HDL, Triglycerides, and  Waist Circumference were discussed in detail.  2) BP/HTN:  His blood pressure is controlled to target.  He is advised to continue Lisinopril-HCT 20-12.5 mg po daily.   3) Lipids/HPL:  His most recent lipid panel from 06/16/22 shows controlled LDL at 47.  He is advised to continue Lipitor 10 mg po daily at bedtime.  Side effects and precautions discussed with him. W  4)  Weight/Diet:  His Body mass index is 25.41 kg/m.-- not a candidate for major weight loss.  CDE Consult has been , exercise, and detailed carbohydrates information provided.  5) Hypocalcemia-1st notice on 09/23/19.   His most recent Calcium level is WNL at 8.9-stable.  He will need repeat measurements of calcium on subsequent visits.   6) Chronic Care/Health Maintenance: -Patient is on ACEI/ARB and Statin medications and encouraged to continue to follow up with Ophthalmology, Podiatrist at least yearly or according to recommendations, and advised to    stay away from smoking. I have recommended yearly flu vaccine and pneumonia vaccination at least every 5 years; moderate intensity exercise for up to 150 minutes weekly; and  sleep for at least 7 hours a day.  - I advised patient to maintain close follow up with Catalina Lunger, DO for primary care needs.     I spent  20  minutes in the care of the patient today including review of labs from CMP, Lipids, Thyroid Function, Hematology (current and previous including abstractions from other facilities); face-to-face time discussing  his blood glucose readings/logs, discussing hypoglycemia and hyperglycemia episodes and symptoms, medications doses, his options of short and long term treatment based on the latest standards of care / guidelines;  discussion about incorporating lifestyle medicine;  and documenting the encounter. Risk reduction counseling performed per USPSTF guidelines to reduce obesity and cardiovascular risk factors.     Please refer to Patient Instructions for Blood Glucose Monitoring and Insulin/Medications Dosing Guide"  in media tab for additional information. Please  also refer to " Patient Self Inventory" in the Media  tab for reviewed elements of pertinent patient history.  Raiyan Tripoli participated in the discussions, expressed understanding, and voiced agreement with the above plans.  All questions were answered to his satisfaction. he is encouraged to contact clinic should he have any questions or concerns prior to his return visit.   Follow up plan: - Return in about 4 months (around 10/28/2022) for Diabetes F/U with A1c in office, No previsit labs, Bring meter and logs.  Ronny Bacon, [facility] [address] [place], [place] [address] Phone: [phone] Fax: [phone]  06/27/2022, 8:39 AM

## 2022-07-19 ENCOUNTER — Other Ambulatory Visit: Payer: Self-pay | Admitting: Nurse Practitioner

## 2022-08-03 ENCOUNTER — Other Ambulatory Visit: Payer: Self-pay | Admitting: Urology

## 2022-08-03 DIAGNOSIS — N3281 Overactive bladder: Secondary | ICD-10-CM

## 2022-09-09 ENCOUNTER — Other Ambulatory Visit: Payer: Self-pay | Admitting: Urology

## 2022-09-14 ENCOUNTER — Other Ambulatory Visit: Payer: Self-pay | Admitting: Nurse Practitioner

## 2022-09-14 ENCOUNTER — Telehealth: Payer: Self-pay

## 2022-09-14 ENCOUNTER — Other Ambulatory Visit: Payer: Self-pay

## 2022-09-14 MED ORDER — OXYBUTYNIN CHLORIDE ER 10 MG PO TB24: 10.0000 mg | ORAL_TABLET | Freq: Every day | ORAL | 0 refills | Status: DC

## 2022-09-14 NOTE — Telephone Encounter (Signed)
Patient has been out of medication for 3 days.   Please fill   oxybutynin (DITROPAN-XL) 10 MG 24 hr tablet   [id number] - [place], [place] - [address] Phone: [phone]  Fax: [phone]

## 2022-09-14 NOTE — Telephone Encounter (Signed)
Rx sent in to cover until overdue follow up appointment. Patient called, made aware, and voiced understanding. Follow up appointment made and sent to patient via mail.

## 2022-10-30 ENCOUNTER — Other Ambulatory Visit: Payer: Self-pay | Admitting: Urology

## 2022-11-03 ENCOUNTER — Ambulatory Visit: Payer: Medicare HMO | Admitting: Nurse Practitioner

## 2022-11-03 ENCOUNTER — Encounter: Payer: Self-pay | Admitting: Nurse Practitioner

## 2022-11-03 VITALS — BP 109/65 | HR 58 | Ht 71.0 in | Wt 179.8 lb

## 2022-11-03 DIAGNOSIS — Z7984 Long term (current) use of oral hypoglycemic drugs: Secondary | ICD-10-CM | POA: Diagnosis not present

## 2022-11-03 DIAGNOSIS — N1831 Chronic kidney disease, stage 3a: Secondary | ICD-10-CM

## 2022-11-03 DIAGNOSIS — E782 Mixed hyperlipidemia: Secondary | ICD-10-CM | POA: Diagnosis not present

## 2022-11-03 DIAGNOSIS — I10 Essential (primary) hypertension: Secondary | ICD-10-CM | POA: Diagnosis not present

## 2022-11-03 DIAGNOSIS — E1122 Type 2 diabetes mellitus with diabetic chronic kidney disease: Secondary | ICD-10-CM

## 2022-11-03 LAB — POCT GLYCOSYLATED HEMOGLOBIN (HGB A1C): Hemoglobin A1C: 7.1 % — AB (ref 4.0–5.6)

## 2022-11-03 MED ORDER — ONETOUCH DELICA PLUS LANCET30G MISC: 1 refills | Status: DC

## 2022-11-03 MED ORDER — METFORMIN HCL 500 MG PO TABS: 500.0000 mg | ORAL_TABLET | Freq: Every day | ORAL | 3 refills | Status: DC

## 2022-11-03 MED ORDER — ONETOUCH SURESOFT LANCING DEV MISC: 0 refills | Status: DC

## 2022-11-03 MED ORDER — GLIPIZIDE ER 5 MG PO TB24: 5.0000 mg | ORAL_TABLET | Freq: Every day | ORAL | 3 refills | Status: DC

## 2022-11-03 NOTE — Progress Notes (Signed)
11/03/2022                    Endocrinology follow-up note    Subjective:    Patient ID: Darryl Mayer, male    DOB: 11-08-1955. Patient is being seen in follow-up  for management of diabetes requested by  Catalina Lunger, DO  Past Medical History:  Diagnosis Date   Diabetes mellitus, type II (HCC)    Hyperlipidemia    Hypertension    Past Surgical History:  Procedure Laterality Date   HEMORROIDECTOMY     Social History   Socioeconomic History   Marital status: Married    Spouse name: Not on file   Number of children: Not on file   Years of education: Not on file   Highest education level: Not on file  Occupational History   Not on file  Tobacco Use   Smoking status: Never   Smokeless tobacco: Never  Vaping Use   Vaping status: Never Used  Substance and Sexual Activity   Alcohol use: Not on file   Drug use: No   Sexual activity: Not on file  Other Topics Concern   Not on file  Social History Narrative   Not on file   Social Determinants of Health   Financial Resource Strain: Low Risk  (12/01/2021)   Received from [facility], [facility]   Overall Financial Resource Strain (CARDIA)    Difficulty of Paying Living Expenses: Not very hard  Food Insecurity: No Food Insecurity (12/01/2021)   Received from [facility], [facility]   Hunger Vital Sign    Worried About Running Out of Food in the Last Year: Never true    Ran Out of Food in the Last Year: Never true  Transportation Needs: No Transportation Needs (08/25/2021)   Received from [facility], [facility]   [facility] - Transportation    Lack of Transportation (Medical): No    Lack of Transportation (Non-Medical): No  Physical Activity: Not on file  Stress: Not on file  Social Connections: Not on file   Outpatient Encounter Medications as of 11/03/2022  Medication Sig   alfuzosin (UROXATRAL) 10 MG 24 hr tablet TAKE 1 TABLET BY MOUTH EVERYDAY AT BEDTIME   aspirin 81 MG EC tablet  Take by mouth.   atorvastatin (LIPITOR) 10 MG tablet Take 10 mg by mouth daily.   baclofen (LIORESAL) 10 MG tablet Take 10 mg by mouth 3 (three) times daily as needed.   Blood Glucose Monitoring Suppl (ONETOUCH VERIO) w/Device KIT Use as instructed to test blood glucose twice daily   finasteride (PROSCAR) 5 MG tablet TAKE 1 TABLET BY MOUTH EVERY DAY   finasteride (PROSCAR) 5 MG tablet TAKE 1 TABLET (5 MG TOTAL) BY MOUTH DAILY.   glucose blood (ONETOUCH VERIO) test strip USE AS DIRECTED to monitor glucose twice daily.   ketoconazole (NIZORAL) 2 % shampoo Apply topically 2 (two) times a week.   Lancets Misc. (ONE TOUCH SURESOFT) MISC Use to check glucose twice daily   latanoprost (XALATAN) 0.005 % ophthalmic solution SMARTSIG:1 In Eye(s) Every Evening   lisinopril-hydrochlorothiazide (PRINZIDE,ZESTORETIC) 20-12.5 MG tablet Take 2 tablets by mouth daily.   mirabegron ER (MYRBETRIQ) 25 MG TB24 tablet Take 1 tablet (25 mg total) by mouth daily.   NIFEdipine (PROCARDIA XL/NIFEDICAL-XL) 90 MG 24 hr tablet TAKE 1 TABLET BY MOUTH EVERY DAY   oxybutynin (DITROPAN-XL) 10 MG 24 hr tablet TAKE 1 TABLET BY MOUTH EVERY DAY  oxybutynin (DITROPAN-XL) 10 MG 24 hr tablet TAKE 1 TABLET BY MOUTH EVERY DAY   tadalafil (CIALIS) 5 MG tablet Take 5 mg by mouth daily as needed.   [DISCONTINUED] glipiZIDE (GLUCOTROL XL) 5 MG 24 hr tablet Take 1 tablet (5 mg total) by mouth daily with breakfast.   [DISCONTINUED] Lancets (ONETOUCH DELICA PLUS LANCET30G) MISC USE TO CHECK BLOOD SUGAR TWICE DAILY. E11.65   [DISCONTINUED] metFORMIN (GLUCOPHAGE) 500 MG tablet Take 1 tablet (500 mg total) by mouth daily with breakfast.   glipiZIDE (GLUCOTROL XL) 5 MG 24 hr tablet Take 1 tablet (5 mg total) by mouth daily with breakfast.   Lancets (ONETOUCH DELICA PLUS LANCET30G) MISC USE TO CHECK BLOOD SUGAR TWICE DAILY. E11.65   metFORMIN (GLUCOPHAGE) 500 MG tablet Take 1 tablet (500 mg total) by mouth daily with breakfast.   potassium  chloride (KLOR-CON) 10 MEQ tablet Take by mouth. (Patient not taking: Reported on 11/03/2022)   No facility-administered encounter medications on file as of 11/03/2022.   ALLERGIES: No Known Allergies VACCINATION STATUS: Immunization History  Administered Date(s) Administered   PFIZER(Purple Top)SARS-COV-2 Vaccination 11/25/2019    Diabetes He presents for his follow-up diabetic visit. He has type 2 diabetes mellitus. Onset time: He was diagnosed at approximate age of 38 years. His disease course has been stable. There are no hypoglycemic associated symptoms. Pertinent negatives for hypoglycemia include no confusion, headaches, pallor or seizures. There are no diabetic associated symptoms. Pertinent negatives for diabetes include no chest pain, no fatigue, no polydipsia, no polyphagia, no polyuria and no weakness. There are no hypoglycemic complications. Symptoms are stable. Diabetic complications include impotence and nephropathy. Risk factors for coronary artery disease include diabetes mellitus, dyslipidemia, male sex, hypertension and sedentary lifestyle. Current diabetic treatment includes oral agent (dual therapy). He is compliant with treatment all of the time. His weight is fluctuating minimally. He is following a generally healthy diet. When asked about meal planning, he reported none. He has not had a previous visit with a dietitian. He participates in exercise intermittently (has not been as active during the winter months). His home blood glucose trend is fluctuating minimally. His breakfast blood glucose range is generally 130-140 mg/dl. His bedtime blood glucose range is generally 140-180 mg/dl. His overall blood glucose range is 140-180 mg/dl. (He presents today with his meter and logs showing at goal glycemic profile overall.  His POCT A1c today is 7.1%, improving from last visit of 7.5%.  He denies any hypoglycemia.  Analysis of his meter shows 7-day average of 153, 14-day average of 146,  30-day average of 139, 90-day average of 149.) An ACE inhibitor/angiotensin II receptor blocker is being taken. He does not see a podiatrist.Eye exam is current.  Hyperlipidemia This is a chronic problem. The current episode started more than 1 year ago. The problem is controlled. Recent lipid tests were reviewed and are normal. Exacerbating diseases include chronic renal disease and diabetes. Factors aggravating his hyperlipidemia include thiazides. Pertinent negatives include no chest pain, myalgias or shortness of breath. Current antihyperlipidemic treatment includes statins. The current treatment provides moderate improvement of lipids. There are no compliance problems.  Risk factors for coronary artery disease include diabetes mellitus, dyslipidemia, hypertension, male sex and a sedentary lifestyle.  Hypertension This is a chronic problem. The current episode started more than 1 year ago. The problem has been resolved since onset. The problem is controlled. Pertinent negatives include no chest pain, headaches, neck pain, palpitations or shortness of breath. There are no associated agents  to hypertension. Risk factors for coronary artery disease include dyslipidemia, diabetes mellitus, male gender and sedentary lifestyle. Past treatments include diuretics and ACE inhibitors. The current treatment provides moderate improvement. There are no compliance problems.  Hypertensive end-organ damage includes kidney disease. Identifiable causes of hypertension include chronic renal disease.    Review of systems  Constitutional: + Minimally fluctuating body weight,  current Body mass index is 25.08 kg/m. , no fatigue, no subjective hyperthermia, no subjective hypothermia Eyes: no blurry vision, no xerophthalmia ENT: no sore throat, no nodules palpated in throat, no dysphagia/odynophagia, no hoarseness Cardiovascular: no chest pain, no shortness of breath, no palpitations, no leg swelling Respiratory: no  cough, no shortness of breath Gastrointestinal: no nausea/vomiting/diarrhea Musculoskeletal: no muscle/joint aches Skin: no rashes, no hyperemia Neurological: no tremors, no numbness, no tingling, no dizziness Psychiatric: no depression, no anxiety   Objective:    BP 109/65 (BP Location: Left Arm, Patient Position: Sitting, Cuff Size: Large)   Pulse (!) 58   Ht 5\' 11"  (1.803 m)   Wt 179 lb 12.8 oz (81.6 kg)   BMI 25.08 kg/m   Wt Readings from Last 3 Encounters:  11/03/22 179 lb 12.8 oz (81.6 kg)  06/27/22 182 lb 3.2 oz (82.6 kg)  02/25/22 180 lb 6.4 oz (81.8 kg)    BP Readings from Last 3 Encounters:  11/03/22 109/65  06/27/22 127/73  02/25/22 119/78     Physical Exam- Limited  Constitutional:  Body mass index is 25.08 kg/m. , not in acute distress, normal state of mind Eyes:  EOMI, no exophthalmos Musculoskeletal: no gross deformities, strength intact in all four extremities, no gross restriction of joint movements Skin:  no rashes, no hyperemia Neurological: no tremor with outstretched hands   Diabetic Foot Exam - Simple   Simple Foot Form Diabetic Foot exam was performed with the following findings: Yes 11/03/2022  8:50 AM  Visual Inspection No deformities, no ulcerations, no other skin breakdown bilaterally: Yes Sensation Testing Intact to touch and monofilament testing bilaterally: Yes Pulse Check Posterior Tibialis and Dorsalis pulse intact bilaterally: Yes Comments    Recent Results (from the past 2160 hour(s))  HgB A1c     Status: Abnormal   Collection Time: 11/03/22  8:44 AM  Result Value Ref Range   Hemoglobin A1C 7.1 (A) 4.0 - 5.6 %   HbA1c POC (<> result, manual entry)     HbA1c, POC (prediabetic range)     HbA1c, POC (controlled diabetic range)           Assessment & Plan:   1) Uncontrolled type 2 diabetes mellitus with complication, without long-term current use of insulin (HCC)  He presents today with his meter and logs showing at  goal glycemic profile overall.  His POCT A1c today is 7.1%, improving from last visit of 7.5%.  He denies any hypoglycemia.  Analysis of his meter shows 7-day average of 153, 14-day average of 146, 30-day average of 139, 90-day average of 149.  - His diabetes is complicated by CKD, however patient remains at a high risk for more acute and chronic complications of diabetes which include CAD, CVA, CKD, retinopathy, and neuropathy. These are all discussed in detail with the patient.  - Nutritional counseling repeated at each appointment due to patients tendency to fall back in to old habits.  - The patient admits there is a room for improvement in their diet and drink choices. -  Suggestion is made for the patient to avoid simple carbohydrates from  their diet including Cakes, Sweet Desserts / Pastries, Ice Cream, Soda (diet and regular), Sweet Tea, Candies, Chips, Cookies, Sweet Pastries, Store Bought Juices, Alcohol in Excess of 1-2 drinks a day, Artificial Sweeteners, Coffee Creamer, and "Sugar-free" Products. This will help patient to have stable blood glucose profile and potentially avoid unintended weight gain.   - I encouraged the patient to switch to unprocessed or minimally processed complex starch and increased protein intake (animal or plant source), fruits, and vegetables.   - Patient is advised to stick to a routine mealtimes to eat 3 meals a day and avoid unnecessary snacks (to snack only to correct hypoglycemia).  - I have approached patient with the following individualized plan to manage diabetes and patient agrees:   -Based on his stable glycemic profile, he is advised to continue current medication regimen of Metformin 500 mg po daily with breakfast and Glipizide 5 mg XL daily with breakfast.   -He is encouraged to continue monitoring blood glucose at least once daily, before breakfast and to call the clinic if his readings are less than 70 or greater than 200 for 3 tests in a  row.  - Patient specific target  A1c;  LDL, HDL, Triglycerides, and  Waist Circumference were discussed in detail.  2) BP/HTN:  His blood pressure is controlled to target.  He is advised to continue Lisinopril-HCT 20-12.5 mg po daily.   3) Lipids/HPL:  His most recent lipid panel from 06/16/22 shows controlled LDL at 47.  He is advised to continue Lipitor 10 mg po daily at bedtime.  Side effects and precautions discussed with him.   4)  Weight/Diet:  His Body mass index is 25.08 kg/m.-- not a candidate for major weight loss.  CDE Consult has been , exercise, and detailed carbohydrates information provided.  5) Hypocalcemia-1st notice on 09/23/19.   His most recent Calcium level is WNL at 8.9-stable.  He will need repeat measurements of calcium on subsequent visits. Will recheck CMP prior to next visit.  6) Chronic Care/Health Maintenance: -Patient is on ACEI/ARB and Statin medications and encouraged to continue to follow up with Ophthalmology, Podiatrist at least yearly or according to recommendations, and advised to   stay away from smoking. I have recommended yearly flu vaccine and pneumonia vaccination at least every 5 years; moderate intensity exercise for up to 150 minutes weekly; and  sleep for at least 7 hours a day.  - I advised patient to maintain close follow up with Catalina Lunger, DO for primary care needs.     I spent  40  minutes in the care of the patient today including review of labs from CMP, Lipids, Thyroid Function, Hematology (current and previous including abstractions from other facilities); face-to-face time discussing  his blood glucose readings/logs, discussing hypoglycemia and hyperglycemia episodes and symptoms, medications doses, his options of short and long term treatment based on the latest standards of care / guidelines;  discussion about incorporating lifestyle medicine;  and documenting the encounter. Risk reduction counseling performed per USPSTF guidelines  to reduce obesity and cardiovascular risk factors.     Please refer to Patient Instructions for Blood Glucose Monitoring and Insulin/Medications Dosing Guide"  in media tab for additional information. Please  also refer to " Patient Self Inventory" in the Media  tab for reviewed elements of pertinent patient history.  Terin Squier participated in the discussions, expressed understanding, and voiced agreement with the above plans.  All questions were answered to his satisfaction. he is encouraged  to contact clinic should he have any questions or concerns prior to his return visit.   Follow up plan: - Return in about 4 months (around 03/05/2023) for Diabetes F/U with A1c in office, Previsit labs, Bring meter and logs.  Ronny Bacon, [facility] [address] [place], [place] [address] Phone: [phone] Fax: [phone]  11/03/2022, 8:57 AM

## 2022-11-23 ENCOUNTER — Ambulatory Visit: Payer: Medicare HMO | Admitting: Urology

## 2022-11-23 VITALS — BP 105/59 | HR 66

## 2022-11-23 DIAGNOSIS — N3281 Overactive bladder: Secondary | ICD-10-CM | POA: Diagnosis not present

## 2022-11-23 DIAGNOSIS — R351 Nocturia: Secondary | ICD-10-CM | POA: Diagnosis not present

## 2022-11-23 DIAGNOSIS — N401 Enlarged prostate with lower urinary tract symptoms: Secondary | ICD-10-CM

## 2022-11-23 DIAGNOSIS — N138 Other obstructive and reflux uropathy: Secondary | ICD-10-CM | POA: Diagnosis not present

## 2022-11-23 LAB — URINALYSIS, ROUTINE W REFLEX MICROSCOPIC
Bilirubin, UA: NEGATIVE
Ketones, UA: NEGATIVE
Leukocytes,UA: NEGATIVE
Nitrite, UA: NEGATIVE
Protein,UA: NEGATIVE
RBC, UA: NEGATIVE
Specific Gravity, UA: 1.025 (ref 1.005–1.030)
Urobilinogen, Ur: 0.2 mg/dL (ref 0.2–1.0)
pH, UA: 6 (ref 5.0–7.5)

## 2022-11-23 MED ORDER — ALFUZOSIN HCL ER 10 MG PO TB24
10.0000 mg | ORAL_TABLET | Freq: Every day | ORAL | 3 refills | Status: DC
Start: 2022-11-23 — End: 2023-01-04

## 2022-11-23 MED ORDER — FINASTERIDE 5 MG PO TABS
5.0000 mg | ORAL_TABLET | Freq: Every day | ORAL | 3 refills | Status: DC
Start: 2022-11-23 — End: 2023-01-04

## 2022-11-23 MED ORDER — GEMTESA 75 MG PO TABS: 1.0000 | ORAL_TABLET | Freq: Every day | ORAL | Status: DC

## 2022-11-23 NOTE — Progress Notes (Signed)
11/23/2022 11:42 AM   Darryl Mayer 1955/07/08 [id number]  Referring provider: Catalina Lunger, DO [address] [place],  [place] [address]  Followup OAB and BPH   HPI: Darryl Mayer is a 67yo here for followup for OAB and BPH. IPSS 4 QOL 3 on uroxatral 10mg  daily, finasteride 5mg  daily and ditropan 10mg . Nocturia 2-3x. Urine stream strong. No straining to urinate. He has rare urinary urgency and no urge incontinence.    PMH: Past Medical History:  Diagnosis Date   Diabetes mellitus, type II (HCC)    Hyperlipidemia    Hypertension     Surgical History: Past Surgical History:  Procedure Laterality Date   HEMORROIDECTOMY      Home Medications:  Allergies as of 11/23/2022   No Known Allergies      Medication List        Accurate as of November 23, 2022 11:42 AM. If you have any questions, ask your nurse or doctor.          alfuzosin 10 MG 24 hr tablet Commonly known as: UROXATRAL TAKE 1 TABLET BY MOUTH EVERYDAY AT BEDTIME   aspirin EC 81 MG tablet Take by mouth.   atorvastatin 10 MG tablet Commonly known as: LIPITOR Take 10 mg by mouth daily.   baclofen 10 MG tablet Commonly known as: LIORESAL Take 10 mg by mouth 3 (three) times daily as needed.   finasteride 5 MG tablet Commonly known as: PROSCAR TAKE 1 TABLET BY MOUTH EVERY DAY   finasteride 5 MG tablet Commonly known as: PROSCAR TAKE 1 TABLET (5 MG TOTAL) BY MOUTH DAILY.   glipiZIDE 5 MG 24 hr tablet Commonly known as: GLUCOTROL XL Take 1 tablet (5 mg total) by mouth daily with breakfast.   ketoconazole 2 % shampoo Commonly known as: NIZORAL Apply topically 2 (two) times a week.   latanoprost 0.005 % ophthalmic solution Commonly known as: XALATAN SMARTSIG:1 In Eye(s) Every Evening   lisinopril-hydrochlorothiazide 20-12.5 MG tablet Commonly known as: ZESTORETIC Take 2 tablets by mouth daily.   metFORMIN 500 MG tablet Commonly known as: GLUCOPHAGE Take 1 tablet (500 mg total) by mouth daily  with breakfast.   mirabegron ER 25 MG Tb24 tablet Commonly known as: MYRBETRIQ Take 1 tablet (25 mg total) by mouth daily.   NIFEdipine 90 MG 24 hr tablet Commonly known as: PROCARDIA XL/NIFEDICAL-XL TAKE 1 TABLET BY MOUTH EVERY DAY   ONE TOUCH SURESOFT Misc Use to check glucose twice daily   OneTouch Delica Plus Lancet30G Misc USE TO CHECK BLOOD SUGAR TWICE DAILY. E11.65   OneTouch Verio test strip Generic drug: glucose blood USE AS DIRECTED to monitor glucose twice daily.   OneTouch Verio w/Device Kit Use as instructed to test blood glucose twice daily   oxybutynin 10 MG 24 hr tablet Commonly known as: DITROPAN-XL TAKE 1 TABLET BY MOUTH EVERY DAY   oxybutynin 10 MG 24 hr tablet Commonly known as: DITROPAN-XL TAKE 1 TABLET BY MOUTH EVERY DAY   potassium chloride 10 MEQ tablet Commonly known as: KLOR-CON Take by mouth.   tadalafil 5 MG tablet Commonly known as: CIALIS Take 5 mg by mouth daily as needed.        Allergies: No Known Allergies  Family History: Family History  Problem Relation Age of Onset   Hypertension Father    CAD Father    Heart attack Father    Hypertension Sister    Diabetes Sister    Hypertension Brother    Diabetes Brother  Social History:  reports that he has never smoked. He has never used smokeless tobacco. He reports that he does not use drugs. No history on file for alcohol use.  ROS: All other review of systems were reviewed and are negative except what is noted above in HPI  Physical Exam: BP (!) 105/59   Pulse 66   Constitutional:  Alert and oriented, No acute distress. HEENT: [place] AT, moist mucus membranes.  Trachea midline, no masses. Cardiovascular: No clubbing, cyanosis, or edema. Respiratory: Normal respiratory effort, no increased work of breathing. GI: Abdomen is soft, nontender, nondistended, no abdominal masses GU: No CVA tenderness.  Lymph: No cervical or inguinal lymphadenopathy. Skin: No rashes, bruises  or suspicious lesions. Neurologic: Grossly intact, no focal deficits, moving all 4 extremities. Psychiatric: Normal mood and affect.  Laboratory Data: Lab Results  Component Value Date   WBC 7.3 03/24/2020   HGB 12.4 (L) 03/24/2020   HCT 37.6 (L) 03/24/2020   MCV 91.9 03/24/2020   PLT 251 03/24/2020    Lab Results  Component Value Date   CREATININE 1.5 (A) 02/16/2021    No results found for: "PSA"  No results found for: "TESTOSTERONE"  Lab Results  Component Value Date   HGBA1C 7.1 (A) 11/03/2022    Urinalysis    Component Value Date/Time   COLORURINE YELLOW 03/24/2020 1126   APPEARANCEUR Clear 07/08/2020 0835   LABSPEC 1.012 03/24/2020 1126   PHURINE 6.0 03/24/2020 1126   GLUCOSEU 3+ (A) 07/08/2020 0835   HGBUR NEGATIVE 03/24/2020 1126   BILIRUBINUR Negative 07/08/2020 0835   KETONESUR NEGATIVE 03/24/2020 1126   PROTEINUR Negative 07/08/2020 0835   PROTEINUR NEGATIVE 03/24/2020 1126   NITRITE Negative 07/08/2020 0835   NITRITE NEGATIVE 03/24/2020 1126   LEUKOCYTESUR Negative 07/08/2020 0835   LEUKOCYTESUR NEGATIVE 03/24/2020 1126    Lab Results  Component Value Date   LABMICR See below: 07/08/2020   WBCUA None seen 07/08/2020   LABEPIT None seen 07/08/2020   BACTERIA None seen 07/08/2020    Pertinent Imaging:  No results found for this or any previous visit.  No results found for this or any previous visit.  No results found for this or any previous visit.  No results found for this or any previous visit.  No results found for this or any previous visit.  No valid procedures specified. No results found for this or any previous visit.  No results found for this or any previous visit.   Assessment & Plan:    1. OAB (overactive bladder) -gemtesa 75mg  daily - Urinalysis, Routine w reflex microscopic  2. Benign prostatic hyperplasia with urinary obstruction Continue uroxatral and finasteride  3. Nocturia We will trial Gemtesa 75mg   daily   No follow-ups on file.  Wilkie Aye, MD  [facility] [place]

## 2022-11-24 LAB — PSA: Prostate Specific Ag, Serum: 1.4 ng/mL (ref 0.0–4.0)

## 2022-12-01 ENCOUNTER — Encounter: Payer: Self-pay | Admitting: Urology

## 2022-12-01 NOTE — Patient Instructions (Signed)

## 2023-01-04 ENCOUNTER — Ambulatory Visit: Payer: Medicare HMO | Admitting: Urology

## 2023-01-04 VITALS — BP 121/67 | HR 72

## 2023-01-04 DIAGNOSIS — N401 Enlarged prostate with lower urinary tract symptoms: Secondary | ICD-10-CM | POA: Diagnosis not present

## 2023-01-04 DIAGNOSIS — R351 Nocturia: Secondary | ICD-10-CM | POA: Diagnosis not present

## 2023-01-04 DIAGNOSIS — N138 Other obstructive and reflux uropathy: Secondary | ICD-10-CM | POA: Diagnosis not present

## 2023-01-04 DIAGNOSIS — N3281 Overactive bladder: Secondary | ICD-10-CM | POA: Diagnosis not present

## 2023-01-04 LAB — URINALYSIS, ROUTINE W REFLEX MICROSCOPIC
Bilirubin, UA: NEGATIVE
Ketones, UA: NEGATIVE
Leukocytes,UA: NEGATIVE
Nitrite, UA: NEGATIVE
Protein,UA: NEGATIVE
RBC, UA: NEGATIVE
Specific Gravity, UA: 1.03 (ref 1.005–1.030)
Urobilinogen, Ur: 0.2 mg/dL (ref 0.2–1.0)
pH, UA: 6 (ref 5.0–7.5)

## 2023-01-04 MED ORDER — FINASTERIDE 5 MG PO TABS: 5.0000 mg | ORAL_TABLET | Freq: Every day | ORAL | 3 refills | Status: DC

## 2023-01-04 MED ORDER — MIRABEGRON ER 25 MG PO TB24
25.0000 mg | ORAL_TABLET | Freq: Every day | ORAL | Status: DC
Start: 2023-01-04 — End: 2023-06-27

## 2023-01-04 MED ORDER — ALFUZOSIN HCL ER 10 MG PO TB24: 10.0000 mg | ORAL_TABLET | Freq: Every day | ORAL | 3 refills | Status: DC

## 2023-01-04 NOTE — Progress Notes (Signed)
01/04/2023 2:38 PM   Susann Givens Renshaw 1955-10-22 [id number]  Referring provider: Catalina Lunger, DO [address] [place],  [place] [address]  Followup BPH and OAB   HPI: Mr Darryl Mayer is a 67yo here for followup for BPH and OAB. IPSS 3 QOL 1 on uroxatral and finasteride. Gemtesa failed to improve his urinary urgency and nocturia. Nocturia 2-3x. Uirne stream strong. No straining to urinate. NO urge incontinence. No other complaints today   PMH: Past Medical History:  Diagnosis Date   Diabetes mellitus, type II (HCC)    Hyperlipidemia    Hypertension     Surgical History: Past Surgical History:  Procedure Laterality Date   HEMORROIDECTOMY      Home Medications:  Allergies as of 01/04/2023   No Known Allergies      Medication List        Accurate as of January 04, 2023  2:38 PM. If you have any questions, ask your nurse or doctor.          alfuzosin 10 MG 24 hr tablet Commonly known as: UROXATRAL Take 1 tablet (10 mg total) by mouth at bedtime.   aspirin EC 81 MG tablet Take by mouth.   atorvastatin 10 MG tablet Commonly known as: LIPITOR Take 10 mg by mouth daily.   baclofen 10 MG tablet Commonly known as: LIORESAL Take 10 mg by mouth 3 (three) times daily as needed.   finasteride 5 MG tablet Commonly known as: PROSCAR TAKE 1 TABLET (5 MG TOTAL) BY MOUTH DAILY.   finasteride 5 MG tablet Commonly known as: PROSCAR Take 1 tablet (5 mg total) by mouth daily.   Gemtesa 75 MG Tabs Generic drug: Vibegron Take 1 tablet (75 mg total) by mouth daily.   glipiZIDE 5 MG 24 hr tablet Commonly known as: GLUCOTROL XL Take 1 tablet (5 mg total) by mouth daily with breakfast.   ketoconazole 2 % shampoo Commonly known as: NIZORAL Apply topically 2 (two) times a week.   latanoprost 0.005 % ophthalmic solution Commonly known as: XALATAN SMARTSIG:1 In Eye(s) Every Evening   lisinopril-hydrochlorothiazide 20-12.5 MG tablet Commonly known as: ZESTORETIC Take 2  tablets by mouth daily.   metFORMIN 500 MG tablet Commonly known as: GLUCOPHAGE Take 1 tablet (500 mg total) by mouth daily with breakfast.   mirabegron ER 25 MG Tb24 tablet Commonly known as: MYRBETRIQ Take 1 tablet (25 mg total) by mouth daily.   NIFEdipine 90 MG 24 hr tablet Commonly known as: PROCARDIA XL/NIFEDICAL-XL TAKE 1 TABLET BY MOUTH EVERY DAY   ONE TOUCH SURESOFT Misc Use to check glucose twice daily   OneTouch Delica Plus Lancet30G Misc USE TO CHECK BLOOD SUGAR TWICE DAILY. E11.65   OneTouch Verio test strip Generic drug: glucose blood USE AS DIRECTED to monitor glucose twice daily.   OneTouch Verio w/Device Kit Use as instructed to test blood glucose twice daily   oxybutynin 10 MG 24 hr tablet Commonly known as: DITROPAN-XL TAKE 1 TABLET BY MOUTH EVERY DAY   potassium chloride 10 MEQ tablet Commonly known as: KLOR-CON Take by mouth.   tadalafil 5 MG tablet Commonly known as: CIALIS Take 5 mg by mouth daily as needed.        Allergies: No Known Allergies  Family History: Family History  Problem Relation Age of Onset   Hypertension Father    CAD Father    Heart attack Father    Hypertension Sister    Diabetes Sister    Hypertension Brother  Diabetes Brother     Social History:  reports that he has never smoked. He has never used smokeless tobacco. He reports that he does not use drugs. No history on file for alcohol use.  ROS: All other review of systems were reviewed and are negative except what is noted above in HPI  Physical Exam: BP 121/67   Pulse 72   Constitutional:  Alert and oriented, No acute distress. HEENT: [place] AT, moist mucus membranes.  Trachea midline, no masses. Cardiovascular: No clubbing, cyanosis, or edema. Respiratory: Normal respiratory effort, no increased work of breathing. GI: Abdomen is soft, nontender, nondistended, no abdominal masses GU: No CVA tenderness.  Lymph: No cervical or inguinal  lymphadenopathy. Skin: No rashes, bruises or suspicious lesions. Neurologic: Grossly intact, no focal deficits, moving all 4 extremities. Psychiatric: Normal mood and affect.  Laboratory Data: Lab Results  Component Value Date   WBC 7.3 03/24/2020   HGB 12.4 (L) 03/24/2020   HCT 37.6 (L) 03/24/2020   MCV 91.9 03/24/2020   PLT 251 03/24/2020    Lab Results  Component Value Date   CREATININE 1.5 (A) 02/16/2021    No results found for: "PSA"  No results found for: "TESTOSTERONE"  Lab Results  Component Value Date   HGBA1C 7.1 (A) 11/03/2022    Urinalysis    Component Value Date/Time   COLORURINE YELLOW 03/24/2020 1126   APPEARANCEUR Clear 11/23/2022 1126   LABSPEC 1.012 03/24/2020 1126   PHURINE 6.0 03/24/2020 1126   GLUCOSEU 3+ (A) 11/23/2022 1126   HGBUR NEGATIVE 03/24/2020 1126   BILIRUBINUR Negative 11/23/2022 1126   KETONESUR NEGATIVE 03/24/2020 1126   PROTEINUR Negative 11/23/2022 1126   PROTEINUR NEGATIVE 03/24/2020 1126   NITRITE Negative 11/23/2022 1126   NITRITE NEGATIVE 03/24/2020 1126   LEUKOCYTESUR Negative 11/23/2022 1126   LEUKOCYTESUR NEGATIVE 03/24/2020 1126    Lab Results  Component Value Date   LABMICR Comment 11/23/2022   WBCUA None seen 07/08/2020   LABEPIT None seen 07/08/2020   BACTERIA None seen 07/08/2020    Pertinent Imaging:  No results found for this or any previous visit.  No results found for this or any previous visit.  No results found for this or any previous visit.  No results found for this or any previous visit.  No results found for this or any previous visit.  No valid procedures specified. No results found for this or any previous visit.  No results found for this or any previous visit.   Assessment & Plan:    1. OAB (overactive bladder) Mirabegron 25mg  daily - Urinalysis, Routine w reflex microscopic  2. Benign prostatic hyperplasia with urinary obstruction -Uroxatral 10mg  daily and finasteride 5mg   daily  3. Nocturia -Uroxatral 10mg  daily and finateride 5mg  daily   No follow-ups on file.  Wilkie Aye, MD  [facility] [place]

## 2023-01-07 ENCOUNTER — Encounter: Payer: Self-pay | Admitting: Urology

## 2023-01-07 NOTE — Patient Instructions (Signed)

## 2023-02-28 ENCOUNTER — Encounter: Payer: Self-pay | Admitting: Nurse Practitioner

## 2023-02-28 LAB — COMPREHENSIVE METABOLIC PANEL: EGFR: 52

## 2023-03-07 ENCOUNTER — Ambulatory Visit: Payer: Medicare HMO | Admitting: Nurse Practitioner

## 2023-03-07 ENCOUNTER — Encounter: Payer: Self-pay | Admitting: Nurse Practitioner

## 2023-03-07 VITALS — BP 111/63 | HR 66 | Ht 71.0 in | Wt 183.4 lb

## 2023-03-07 DIAGNOSIS — Z7984 Long term (current) use of oral hypoglycemic drugs: Secondary | ICD-10-CM | POA: Diagnosis not present

## 2023-03-07 DIAGNOSIS — I10 Essential (primary) hypertension: Secondary | ICD-10-CM

## 2023-03-07 DIAGNOSIS — E782 Mixed hyperlipidemia: Secondary | ICD-10-CM

## 2023-03-07 DIAGNOSIS — N1831 Chronic kidney disease, stage 3a: Secondary | ICD-10-CM | POA: Diagnosis not present

## 2023-03-07 DIAGNOSIS — E1122 Type 2 diabetes mellitus with diabetic chronic kidney disease: Secondary | ICD-10-CM | POA: Diagnosis not present

## 2023-03-07 LAB — POCT GLYCOSYLATED HEMOGLOBIN (HGB A1C): Hemoglobin A1C: 7.8 % — AB (ref 4.0–5.6)

## 2023-03-07 MED ORDER — ONETOUCH DELICA PLUS LANCET30G MISC: 3 refills | Status: DC

## 2023-03-07 NOTE — Progress Notes (Signed)
03/07/2023                    Endocrinology follow-up note    Subjective:    Patient ID: Darryl Mayer, male    DOB: [birth year]. Patient is being seen in follow-up  for management of diabetes requested by  Catalina Lunger, DO  Past Medical History:  Diagnosis Date   Diabetes mellitus, type II (HCC)    Hyperlipidemia    Hypertension    Past Surgical History:  Procedure Laterality Date   HEMORROIDECTOMY     Social History   Socioeconomic History   Marital status: Married    Spouse name: Not on file   Number of children: Not on file   Years of education: Not on file   Highest education level: Not on file  Occupational History   Not on file  Tobacco Use   Smoking status: Never   Smokeless tobacco: Never  Vaping Use   Vaping status: Never Used  Substance and Sexual Activity   Alcohol use: Not on file   Drug use: No   Sexual activity: Not on file  Other Topics Concern   Not on file  Social History Narrative   Not on file   Social Drivers of Health   Financial Resource Strain: Low Risk  (12/08/2022)   Received from [facility]   Overall Financial Resource Strain (CARDIA)    Difficulty of Paying Living Expenses: Not very hard  Food Insecurity: No Food Insecurity (12/08/2022)   [facility]   Hunger Vital Sign    Worried About Running Out of Food in the Last Year: Never true    Ran Out of Food in the Last Year: Never true  Transportation Needs: No Transportation Needs (12/08/2022)   Received from [facility] - Transportation    Lack of Transportation (Medical): No    Lack of Transportation (Non-Medical): No  Physical Activity: Insufficiently Active (12/08/2022)   Received from [facility]   Exercise Vital Sign    Days of Exercise per Week: 3 days    Minutes of Exercise per Session: 40 min  Stress: No Stress Concern Present (12/08/2022)   Received from [facility] -  Occupational Stress Questionnaire    Feeling of Stress : Only a [place]  Social Connections: Moderately Integrated (12/08/2022)   Received from [facility]   Social Connection and Isolation Panel [NHANES]    Frequency of Communication with Friends and Family: More than three times a week    Frequency of Social Gatherings with Friends and Family: More than three times a week    Attends Religious Services: 1 to 4 times per year    Active Member of [employer] or Organizations: No    Attends Banker Meetings: Never    Marital Status: Married   Outpatient Encounter Medications as of 03/07/2023  Medication Sig   alfuzosin (UROXATRAL) 10 MG 24 hr tablet Take 1 tablet (10 mg total) by mouth at bedtime.   aspirin 81 MG EC tablet Take by mouth.   atorvastatin (LIPITOR) 10 MG tablet Take 10 mg by mouth daily.   baclofen (LIORESAL) 10 MG tablet Take 10 mg by mouth 3 (three) times daily as needed.   Blood Glucose Monitoring Suppl (ONETOUCH VERIO) w/Device KIT Use as instructed to test blood glucose twice daily   finasteride (PROSCAR) 5 MG tablet TAKE 1 TABLET (5 MG  TOTAL) BY MOUTH DAILY.   finasteride (PROSCAR) 5 MG tablet Take 1 tablet (5 mg total) by mouth daily.   glipiZIDE (GLUCOTROL XL) 5 MG 24 hr tablet Take 1 tablet (5 mg total) by mouth daily with breakfast.   glucose blood (ONETOUCH VERIO) test strip USE AS DIRECTED to monitor glucose twice daily.   Lancets Misc. (ONE TOUCH SURESOFT) MISC Use to check glucose twice daily   latanoprost (XALATAN) 0.005 % ophthalmic solution SMARTSIG:1 In Eye(s) Every Evening   lisinopril-hydrochlorothiazide (PRINZIDE,ZESTORETIC) 20-12.5 MG tablet Take 2 tablets by mouth daily.   metFORMIN (GLUCOPHAGE) 500 MG tablet Take 1 tablet (500 mg total) by mouth daily with breakfast.   mirabegron ER (MYRBETRIQ) 25 MG TB24 tablet Take 1 tablet (25 mg total) by mouth daily.   NIFEdipine (PROCARDIA XL/NIFEDICAL-XL) 90 MG 24 hr tablet TAKE 1 TABLET BY MOUTH EVERY  DAY   oxybutynin (DITROPAN-XL) 10 MG 24 hr tablet TAKE 1 TABLET BY MOUTH EVERY DAY   [DISCONTINUED] Lancets (ONETOUCH DELICA PLUS LANCET30G) MISC USE TO CHECK BLOOD SUGAR TWICE DAILY. E11.65   ketoconazole (NIZORAL) 2 % shampoo Apply topically 2 (two) times a week. (Patient not taking: Reported on 03/07/2023)   Lancets (ONETOUCH DELICA PLUS LANCET30G) MISC USE TO CHECK BLOOD SUGAR TWICE DAILY. E11.65   potassium chloride (KLOR-CON) 10 MEQ tablet Take by mouth. (Patient not taking: Reported on 11/03/2022)   tadalafil (CIALIS) 5 MG tablet Take 5 mg by mouth daily as needed. (Patient not taking: Reported on 11/23/2022)   No facility-administered encounter medications on file as of 03/07/2023.   ALLERGIES: No Known Allergies VACCINATION STATUS: Immunization History  Administered Date(s) Administered   PFIZER(Purple Top)SARS-COV-2 Vaccination 11/25/2019    Diabetes He presents for his follow-up diabetic visit. He has type 2 diabetes mellitus. Onset time: He was diagnosed at approximate age of 38 years. His disease course has been stable. There are no hypoglycemic associated symptoms. Pertinent negatives for hypoglycemia include no confusion, headaches, pallor or seizures. There are no diabetic associated symptoms. Pertinent negatives for diabetes include no chest pain, no fatigue, no polydipsia, no polyphagia, no polyuria and no weakness. There are no hypoglycemic complications. Symptoms are stable. Diabetic complications include impotence and nephropathy. Risk factors for coronary artery disease include diabetes mellitus, dyslipidemia, male sex, hypertension and sedentary lifestyle. Current diabetic treatment includes oral agent (dual therapy). He is compliant with treatment all of the time. His weight is fluctuating minimally. He is following a generally healthy diet. When asked about meal planning, he reported none. He has not had a previous visit with a dietitian. He participates in exercise  intermittently (has not been as active during the winter months). His home blood glucose trend is fluctuating minimally. His breakfast blood glucose range is generally 140-180 mg/dl. His bedtime blood glucose range is generally 140-180 mg/dl. His overall blood glucose range is 140-180 mg/dl. (He presents today with his meter and logs showing at goal glycemic profile overall.  His POCT A1c today is 7.8%, increasing from last visit of 7.1%.  He denies any hypoglycemia.  Analysis of his meter shows 7-day average of 161, 14-day average of 157, 30-day average of 156, 90-day average of 150.  He notes he has not been as active during these colder temperatures and did have a head cold between visits as well.) An ACE inhibitor/angiotensin II receptor blocker is being taken. He does not see a podiatrist.Eye exam is current.  Hyperlipidemia This is a chronic problem. The current episode started more than 1  year ago. The problem is controlled. Recent lipid tests were reviewed and are normal. Exacerbating diseases include chronic renal disease and diabetes. Factors aggravating his hyperlipidemia include thiazides. Pertinent negatives include no chest pain, myalgias or shortness of breath. Current antihyperlipidemic treatment includes statins. The current treatment provides moderate improvement of lipids. There are no compliance problems.  Risk factors for coronary artery disease include diabetes mellitus, dyslipidemia, hypertension, male sex and a sedentary lifestyle.  Hypertension This is a chronic problem. The current episode started more than 1 year ago. The problem has been resolved since onset. The problem is controlled. Pertinent negatives include no chest pain, headaches, neck pain, palpitations or shortness of breath. There are no associated agents to hypertension. Risk factors for coronary artery disease include dyslipidemia, diabetes mellitus, male gender and sedentary lifestyle. Past treatments include diuretics  and ACE inhibitors. The current treatment provides moderate improvement. There are no compliance problems.  Hypertensive end-organ damage includes kidney disease. Identifiable causes of hypertension include chronic renal disease.    Review of systems  Constitutional: + Minimally fluctuating body weight,  current Body mass index is 25.58 kg/m. , no fatigue, no subjective hyperthermia, no subjective hypothermia Eyes: no blurry vision, no xerophthalmia ENT: no sore throat, no nodules palpated in throat, no dysphagia/odynophagia, no hoarseness Cardiovascular: no chest pain, no shortness of breath, no palpitations, no leg swelling Respiratory: no cough, no shortness of breath Gastrointestinal: no nausea/vomiting/diarrhea Musculoskeletal: intermittent left foot pain  Skin: no rashes, no hyperemia Neurological: no tremors, no numbness, no tingling, no dizziness Psychiatric: no depression, no anxiety   Objective:    BP 111/63 (BP Location: Left Arm, Patient Position: Sitting, Cuff Size: Large)   Pulse 66   Ht 5\' 11"  (1.803 m)   Wt 183 lb 6.4 oz (83.2 kg)   BMI 25.58 kg/m   Wt Readings from Last 3 Encounters:  03/07/23 183 lb 6.4 oz (83.2 kg)  11/03/22 179 lb 12.8 oz (81.6 kg)  06/27/22 182 lb 3.2 oz (82.6 kg)    BP Readings from Last 3 Encounters:  03/07/23 111/63  01/04/23 121/67  11/23/22 (!) 105/59     Physical Exam- Limited  Constitutional:  Body mass index is 25.58 kg/m. , not in acute distress, normal state of mind Eyes:  EOMI, no exophthalmos Musculoskeletal: no gross deformities, strength intact in all four extremities, no gross restriction of joint movements, no obvious musculoskeletal deformity to left foot that could explain his pain Skin:  no rashes, no hyperemia Neurological: no tremor with outstretched hands   Diabetic Foot Exam - Simple   No data filed    Recent Results (from the past 2160 hours)  Urinalysis, Routine w reflex microscopic     Status:  Abnormal   Collection Time: 01/04/23  2:35 PM  Result Value Ref Range   Specific Gravity, UA 1.030 1.005 - 1.030   pH, UA 6.0 5.0 - 7.5   Color, UA Yellow Yellow   Appearance Ur Clear Clear   Leukocytes,UA Negative Negative   Protein,UA Negative Negative/Trace   Glucose, UA 1+ (A) Negative   Ketones, UA Negative Negative   RBC, UA Negative Negative   Bilirubin, UA Negative Negative   Urobilinogen, Ur 0.2 0.2 - 1.0 mg/dL   Nitrite, UA Negative Negative   Microscopic Examination Comment     Comment: Microscopic not indicated and not performed.  Comprehensive metabolic panel     Status: None   Collection Time: 02/28/23 12:00 AM  Result Value Ref Range  EGFR 52.0     Comment: Abstracted by HIM  HgB A1c     Status: Abnormal   Collection Time: 03/07/23  8:15 AM  Result Value Ref Range   Hemoglobin A1C 7.8 (A) 4.0 - 5.6 %   HbA1c POC (<> result, manual entry)     HbA1c, POC (prediabetic range)     HbA1c, POC (controlled diabetic range)           Assessment & Plan:   1) Uncontrolled type 2 diabetes mellitus with complication, without long-term current use of insulin (HCC)  He presents today with his meter and logs showing at goal glycemic profile overall.  His POCT A1c today is 7.8%, increasing from last visit of 7.1%.  He denies any hypoglycemia.  Analysis of his meter shows 7-day average of 161, 14-day average of 157, 30-day average of 156, 90-day average of 150.  He notes he has not been as active during these colder temperatures and did have a head cold between visits as well.  - His diabetes is complicated by CKD, however patient remains at a high risk for more acute and chronic complications of diabetes which include CAD, CVA, CKD, retinopathy, and neuropathy. These are all discussed in detail with the patient.  - Nutritional counseling repeated at each appointment due to patients tendency to fall back in to old habits.  - The patient admits there is a room for  improvement in their diet and drink choices. -  Suggestion is made for the patient to avoid simple carbohydrates from their diet including Cakes, Sweet Desserts / Pastries, Ice Cream, Soda (diet and regular), Sweet Tea, Candies, Chips, Cookies, Sweet Pastries, Store Bought Juices, Alcohol in Excess of 1-2 drinks a day, Artificial Sweeteners, Coffee Creamer, and "Sugar-free" Products. This will help patient to have stable blood glucose profile and potentially avoid unintended weight gain.   - I encouraged the patient to switch to unprocessed or minimally processed complex starch and increased protein intake (animal or plant source), fruits, and vegetables.   - Patient is advised to stick to a routine mealtimes to eat 3 meals a day and avoid unnecessary snacks (to snack only to correct hypoglycemia).  - I have approached patient with the following individualized plan to manage diabetes and patient agrees:   -Based on his stable glycemic profile, he is advised to continue current medication regimen of Metformin 500 mg po daily with breakfast and Glipizide 5 mg XL daily with breakfast.   -He is encouraged to continue monitoring blood glucose at least once daily, before breakfast and to call the clinic if his readings are less than 70 or greater than 200 for 3 tests in a row.  - Patient specific target  A1c;  LDL, HDL, Triglycerides, and  Waist Circumference were discussed in detail.  2) BP/HTN:  His blood pressure is controlled to target.  He is advised to continue Lisinopril-HCT 20-12.5 mg po daily.   3) Lipids/HPL:  His most recent lipid panel from 06/16/22 shows controlled LDL at 47.  He is advised to continue Lipitor 10 mg po daily at bedtime.  Side effects and precautions discussed with him.   4)  Weight/Diet:  His Body mass index is 25.58 kg/m.-- not a candidate for major weight loss.  CDE Consult has been , exercise, and detailed carbohydrates information provided.  5) Hypocalcemia-1st  notice on 09/23/19.   His most recent Calcium level is WNL at 8.9-stable.  He will need repeat measurements of calcium on  subsequent visits.   6) Chronic Care/Health Maintenance: -Patient is on ACEI/ARB and Statin medications and encouraged to continue to follow up with Ophthalmology, Podiatrist at least yearly or according to recommendations, and advised to   stay away from smoking. I have recommended yearly flu vaccine and pneumonia vaccination at least every 5 years; moderate intensity exercise for up to 150 minutes weekly; and  sleep for at least 7 hours a day.  - I advised patient to maintain close follow up with Catalina Lunger, DO for primary care needs.     I spent  33  minutes in the care of the patient today including review of labs from CMP, Lipids, Thyroid Function, Hematology (current and previous including abstractions from other facilities); face-to-face time discussing  his blood glucose readings/logs, discussing hypoglycemia and hyperglycemia episodes and symptoms, medications doses, his options of short and long term treatment based on the latest standards of care / guidelines;  discussion about incorporating lifestyle medicine;  and documenting the encounter. Risk reduction counseling performed per USPSTF guidelines to reduce obesity and cardiovascular risk factors.     Please refer to Patient Instructions for Blood Glucose Monitoring and Insulin/Medications Dosing Guide"  in media tab for additional information. Please  also refer to " Patient Self Inventory" in the Media  tab for reviewed elements of pertinent patient history.  Darryl Mayer participated in the discussions, expressed understanding, and voiced agreement with the above plans.  All questions were answered to his satisfaction. he is encouraged to contact clinic should he have any questions or concerns prior to his return visit.   Follow up plan: - Return in about 4 months (around 07/05/2023) for Diabetes F/U with  A1c in office, No previsit labs, Bring meter and logs.  Ronny Bacon, [facility] [address] [place], [place] [address] Phone: [phone] Fax: [phone]  03/07/2023, 8:24 AM

## 2023-04-10 ENCOUNTER — Telehealth: Payer: Self-pay

## 2023-04-10 NOTE — Telephone Encounter (Signed)
 Patient state's he is going more frequently and unsure of the medication Dr. Ronne Binning took him off. Patient state's he has been having incontinent episode. Patient state's he will call back when he get home to give more information on the medications.

## 2023-04-18 NOTE — Telephone Encounter (Signed)
 Pt schedule to f/u 3/26

## 2023-05-03 ENCOUNTER — Ambulatory Visit: Admitting: Urology

## 2023-05-03 VITALS — BP 134/78 | HR 63

## 2023-05-03 DIAGNOSIS — N401 Enlarged prostate with lower urinary tract symptoms: Secondary | ICD-10-CM | POA: Diagnosis not present

## 2023-05-03 DIAGNOSIS — N138 Other obstructive and reflux uropathy: Secondary | ICD-10-CM | POA: Diagnosis not present

## 2023-05-03 DIAGNOSIS — N3281 Overactive bladder: Secondary | ICD-10-CM | POA: Diagnosis not present

## 2023-05-03 DIAGNOSIS — R351 Nocturia: Secondary | ICD-10-CM | POA: Diagnosis not present

## 2023-05-03 LAB — MICROSCOPIC EXAMINATION: Bacteria, UA: NONE SEEN

## 2023-05-03 LAB — URINALYSIS, ROUTINE W REFLEX MICROSCOPIC
Bilirubin, UA: NEGATIVE
Ketones, UA: NEGATIVE
Leukocytes,UA: NEGATIVE
Nitrite, UA: NEGATIVE
Protein,UA: NEGATIVE
Specific Gravity, UA: 1.01 (ref 1.005–1.030)
Urobilinogen, Ur: 0.2 mg/dL (ref 0.2–1.0)
pH, UA: 6 (ref 5.0–7.5)

## 2023-05-03 MED ORDER — FINASTERIDE 5 MG PO TABS: 5.0000 mg | ORAL_TABLET | Freq: Every day | ORAL | 3 refills | Status: AC

## 2023-05-03 MED ORDER — ALFUZOSIN HCL ER 10 MG PO TB24: 10.0000 mg | ORAL_TABLET | Freq: Every day | ORAL | 3 refills | Status: DC

## 2023-05-03 MED ORDER — SOLIFENACIN SUCCINATE 5 MG PO TABS
5.0000 mg | ORAL_TABLET | Freq: Every day | ORAL | 5 refills | Status: DC
Start: 2023-05-03 — End: 2023-08-01

## 2023-05-03 NOTE — Progress Notes (Signed)
 05/03/2023 11:40 AM   Darryl Mayer Mar 05, 1955 [id number]  Referring provider: Catalina Lunger, DO [address] [place],  [place] [address]  Followup BPH   HPI: Darryl Mayer is a 68yo here for followup for BPH with nocturia and OAB. He is currently on uroxatral 10mg  and finasteride 5mg . He is having issues with urinary incontinence daily. He has nocturia 3-4x. IPSS 16 QOL 4. He stopped mirabegron last visit since it did not improve his urinary urgency and incontinence   PMH: Past Medical History:  Diagnosis Date   Diabetes mellitus, type II (HCC)    Hyperlipidemia    Hypertension     Surgical History: Past Surgical History:  Procedure Laterality Date   HEMORROIDECTOMY      Home Medications:  Allergies as of 05/03/2023   No Known Allergies      Medication List        Accurate as of May 03, 2023 11:40 AM. If you have any questions, ask your nurse or doctor.          alfuzosin 10 MG 24 hr tablet Commonly known as: UROXATRAL Take 1 tablet (10 mg total) by mouth at bedtime.   aspirin EC 81 MG tablet Take by mouth.   atorvastatin 10 MG tablet Commonly known as: LIPITOR Take 10 mg by mouth daily.   baclofen 10 MG tablet Commonly known as: LIORESAL Take 10 mg by mouth 3 (three) times daily as needed.   finasteride 5 MG tablet Commonly known as: PROSCAR TAKE 1 TABLET (5 MG TOTAL) BY MOUTH DAILY.   finasteride 5 MG tablet Commonly known as: PROSCAR Take 1 tablet (5 mg total) by mouth daily.   glipiZIDE 5 MG 24 hr tablet Commonly known as: GLUCOTROL XL Take 1 tablet (5 mg total) by mouth daily with breakfast.   ketoconazole 2 % shampoo Commonly known as: NIZORAL Apply topically 2 (two) times a week.   latanoprost 0.005 % ophthalmic solution Commonly known as: XALATAN SMARTSIG:1 In Eye(s) Every Evening   lisinopril-hydrochlorothiazide 20-12.5 MG tablet Commonly known as: ZESTORETIC Take 2 tablets by mouth daily.   metFORMIN 500 MG tablet Commonly  known as: GLUCOPHAGE Take 1 tablet (500 mg total) by mouth daily with breakfast.   mirabegron ER 25 MG Tb24 tablet Commonly known as: MYRBETRIQ Take 1 tablet (25 mg total) by mouth daily.   NIFEdipine 90 MG 24 hr tablet Commonly known as: PROCARDIA XL/NIFEDICAL-XL TAKE 1 TABLET BY MOUTH EVERY DAY   ONE TOUCH SURESOFT Misc Use to check glucose twice daily   OneTouch Delica Plus Lancet30G Misc USE TO CHECK BLOOD SUGAR TWICE DAILY. E11.65   OneTouch Verio test strip Generic drug: glucose blood USE AS DIRECTED to monitor glucose twice daily.   OneTouch Verio w/Device Kit Use as instructed to test blood glucose twice daily   oxybutynin 10 MG 24 hr tablet Commonly known as: DITROPAN-XL TAKE 1 TABLET BY MOUTH EVERY DAY   potassium chloride 10 MEQ tablet Commonly known as: KLOR-CON Take by mouth.   tadalafil 5 MG tablet Commonly known as: CIALIS Take 5 mg by mouth daily as needed.        Allergies: No Known Allergies  Family History: Family History  Problem Relation Age of Onset   Hypertension Father    CAD Father    Heart attack Father    Hypertension Sister    Diabetes Sister    Hypertension Brother    Diabetes Brother     Social History:  reports that  he has never smoked. He has never used smokeless tobacco. He reports that he does not use drugs. No history on file for alcohol use.  ROS: All other review of systems were reviewed and are negative except what is noted above in HPI  Physical Exam: BP 134/78   Pulse 63   Constitutional:  Alert and oriented, No acute distress. HEENT:  AT, moist mucus membranes.  Trachea midline, no masses. Cardiovascular: No clubbing, cyanosis, or edema. Respiratory: Normal respiratory effort, no increased work of breathing. GI: Abdomen is soft, nontender, nondistended, no abdominal masses GU: No CVA tenderness.  Lymph: No cervical or inguinal lymphadenopathy. Skin: No rashes, bruises or suspicious lesions. Neurologic:  Grossly intact, no focal deficits, moving all 4 extremities. Psychiatric: Normal mood and affect.  Laboratory Data: Lab Results  Component Value Date   WBC 7.3 03/24/2020   HGB 12.4 (L) 03/24/2020   HCT 37.6 (L) 03/24/2020   MCV 91.9 03/24/2020   PLT 251 03/24/2020    Lab Results  Component Value Date   CREATININE 1.5 (A) 02/16/2021    No results found for: "PSA"  No results found for: "TESTOSTERONE"  Lab Results  Component Value Date   HGBA1C 7.8 (A) 03/07/2023    Urinalysis    Component Value Date/Time   COLORURINE YELLOW 03/24/2020 1126   APPEARANCEUR Clear 01/04/2023 1435   LABSPEC 1.012 03/24/2020 1126   PHURINE 6.0 03/24/2020 1126   GLUCOSEU 1+ (A) 01/04/2023 1435   HGBUR NEGATIVE 03/24/2020 1126   BILIRUBINUR Negative 01/04/2023 1435   KETONESUR NEGATIVE 03/24/2020 1126   PROTEINUR Negative 01/04/2023 1435   PROTEINUR NEGATIVE 03/24/2020 1126   NITRITE Negative 01/04/2023 1435   NITRITE NEGATIVE 03/24/2020 1126   LEUKOCYTESUR Negative 01/04/2023 1435   LEUKOCYTESUR NEGATIVE 03/24/2020 1126    Lab Results  Component Value Date   LABMICR Comment 01/04/2023   WBCUA None seen 07/08/2020   LABEPIT None seen 07/08/2020   BACTERIA None seen 07/08/2020    Pertinent Imaging:  No results found for this or any previous visit.  No results found for this or any previous visit.  No results found for this or any previous visit.  No results found for this or any previous visit.  No results found for this or any previous visit.  No results found for this or any previous visit.  No results found for this or any previous visit.  No results found for this or any previous visit.   Assessment & Plan:    1. OAB (overactive bladder) (Primary) Vesicare 5mg  daily - Urinalysis, Routine w reflex microscopic - BLADDER SCAN AMB NON-IMAGING  2. Benign prostatic hyperplasia with urinary obstruction Uroxatral 10mg  daily and finasteride 5mg  daily  3.  Nocturia Uroxatral 10mg  daily and finasteride 5mg  daily   No follow-ups on file.  Wilkie Aye, MD  [facility] [place]

## 2023-05-03 NOTE — Progress Notes (Signed)
post void residual=62

## 2023-05-07 ENCOUNTER — Encounter: Payer: Self-pay | Admitting: Urology

## 2023-05-07 NOTE — Patient Instructions (Signed)

## 2023-05-15 ENCOUNTER — Telehealth: Payer: Self-pay | Admitting: Nurse Practitioner

## 2023-05-15 DIAGNOSIS — E1122 Type 2 diabetes mellitus with diabetic chronic kidney disease: Secondary | ICD-10-CM

## 2023-05-15 NOTE — Telephone Encounter (Signed)
 Pt needs prescription for test strips for One touch Verio sent [facility] [place].

## 2023-05-16 MED ORDER — ONETOUCH VERIO VI STRP: ORAL_STRIP | 3 refills | Status: DC

## 2023-05-16 NOTE — Telephone Encounter (Signed)
 Rx for One Touch Verio glucose test strips sent to [facility].

## 2023-06-21 ENCOUNTER — Ambulatory Visit: Admitting: Urology

## 2023-06-27 ENCOUNTER — Ambulatory Visit: Admitting: Urology

## 2023-06-27 ENCOUNTER — Encounter: Payer: Self-pay | Admitting: Urology

## 2023-06-27 VITALS — BP 124/74 | HR 76

## 2023-06-27 DIAGNOSIS — N3281 Overactive bladder: Secondary | ICD-10-CM | POA: Diagnosis not present

## 2023-06-27 DIAGNOSIS — N401 Enlarged prostate with lower urinary tract symptoms: Secondary | ICD-10-CM

## 2023-06-27 DIAGNOSIS — R350 Frequency of micturition: Secondary | ICD-10-CM | POA: Diagnosis not present

## 2023-06-27 DIAGNOSIS — R351 Nocturia: Secondary | ICD-10-CM | POA: Diagnosis not present

## 2023-06-27 NOTE — Progress Notes (Signed)
 Name: Darryl Mayer DOB: Jan 31, 1956 MRN: [id number]  History of Present Illness: Darryl Mayer is a 68 y.o. male who presents today at [facility] .  GU History includes: 1. BPH with LUTS. - Taking Uroxatral  and Proscar . - 11/23/2022: PSA was normal (1.4). 2. OAB with urinary frequency, nocturia, urgency, urge incontinence. - Failed Myrbetriq  (Mirabegron ) 25 mg daily. 3. Erectile dysfunction.  At last visit with Dr. Claretta Croft on 05/03/2023: - Patient "stopped mirabegron  last visit since it did not improve his urinary urgency and incontinence". - The plan was:  1. For OAB: Start Vesicare  5mg  daily. 2. For BPH: Continue Uroxatral  10mg  daily and finasteride  5mg  daily.  Today: He reports symptomatic improvement since starting Vesicare  (Solifenacin ) 10 mg daily. He reports decreased urinary urgency and denies any recent urge incontinence episodes. Continues to have nocturia x2-3; reports a fair amount of fluid intake within 3 hours prior to bedtime and during the night. Denies significant caffeine intake. He denies dysuria, gross hematuria, straining to void, or sensations of incomplete emptying.   Medications: Current Outpatient Medications  Medication Sig Dispense Refill   alfuzosin  (UROXATRAL ) 10 MG 24 hr tablet Take 1 tablet (10 mg total) by mouth at bedtime. 90 tablet 3   aspirin 81 MG EC tablet Take by mouth.     atorvastatin (LIPITOR) 10 MG tablet Take 10 mg by mouth daily.     baclofen (LIORESAL) 10 MG tablet Take 10 mg by mouth 3 (three) times daily as needed.     Blood Glucose Monitoring Suppl (ONETOUCH VERIO) w/Device KIT Use as instructed to test blood glucose twice daily 1 kit 0   finasteride  (PROSCAR ) 5 MG tablet Take 1 tablet (5 mg total) by mouth daily. 90 tablet 3   finasteride  (PROSCAR ) 5 MG tablet Take 1 tablet (5 mg total) by mouth daily. 90 tablet 3   glipiZIDE  (GLUCOTROL  XL) 5 MG 24 hr tablet Take 1 tablet (5 mg total) by mouth daily with  breakfast. 90 tablet 3   glucose blood (ONETOUCH VERIO) test strip Use to monitor blood glucose once daily as directed. 100 each 3   ketoconazole (NIZORAL) 2 % shampoo Apply topically 2 (two) times a week.     Lancets (ONETOUCH DELICA PLUS LANCET30G) MISC USE TO CHECK BLOOD SUGAR TWICE DAILY. E11.65 200 each 3   Lancets Misc. (ONE TOUCH SURESOFT) MISC Use to check glucose twice daily 1 each 0   latanoprost (XALATAN) 0.005 % ophthalmic solution SMARTSIG:1 In Eye(s) Every Evening     lisinopril-hydrochlorothiazide (PRINZIDE,ZESTORETIC) 20-12.5 MG tablet Take 2 tablets by mouth daily.     metFORMIN  (GLUCOPHAGE ) 500 MG tablet Take 1 tablet (500 mg total) by mouth daily with breakfast. 90 tablet 3   NIFEdipine (PROCARDIA XL/NIFEDICAL-XL) 90 MG 24 hr tablet TAKE 1 TABLET BY MOUTH EVERY DAY     potassium chloride (KLOR-CON) 10 MEQ tablet Take by mouth.     solifenacin  (VESICARE ) 5 MG tablet Take 1 tablet (5 mg total) by mouth daily. 30 tablet 5   tadalafil (CIALIS) 5 MG tablet Take 5 mg by mouth daily as needed.     No current facility-administered medications for this visit.    Allergies: No Known Allergies  Past Medical History:  Diagnosis Date   Diabetes mellitus, type II (HCC)    Hyperlipidemia    Hypertension    Past Surgical History:  Procedure Laterality Date   HEMORROIDECTOMY     Family History  Problem Relation Age of Onset  Hypertension Father    CAD Father    Heart attack Father    Hypertension Sister    Diabetes Sister    Hypertension Brother    Diabetes Brother    Social History   Socioeconomic History   Marital status: Married    Spouse name: Not on file   Number of children: Not on file   Years of education: Not on file   Highest education level: Not on file  Occupational History   Not on file  Tobacco Use   Smoking status: Never   Smokeless tobacco: Never  Vaping Use   Vaping status: Never Used  Substance and Sexual Activity   Alcohol use: Not on file    Drug use: No   Sexual activity: Not on file  Other Topics Concern   Not on file  Social History Narrative   Not on file   Social Drivers of Health   Financial Resource Strain: Low Risk  (12/08/2022)   Received from [facility]   Overall Financial Resource Strain (CARDIA)    Difficulty of Paying Living Expenses: Not very hard  Food Insecurity: No Food Insecurity (12/08/2022)   [facility]   Hunger Vital Sign    Worried About Running Out of Food in the Last Year: Never true    Ran Out of Food in the Last Year: Never true  Transportation Needs: No Transportation Needs (12/08/2022)   Received from [facility] - Transportation    Lack of Transportation (Medical): No    Lack of Transportation (Non-Medical): No  Physical Activity: Insufficiently Active (12/08/2022)   Received from [facility]   Exercise Vital Sign    Days of Exercise per Week: 3 days    Minutes of Exercise per Session: 40 min  Stress: No Stress Concern Present (12/08/2022)   Received from [facility] - Occupational Stress Questionnaire    Feeling of Stress : Only a Pacer  Social Connections: Moderately Integrated (12/08/2022)   Received from [facility]   Social Connection and Isolation Panel [NHANES]    Frequency of Communication with Friends and Family: More than three times a week    Frequency of Social Gatherings with Friends and Family: More than three times a week    Attends Religious Services: 1 to 4 times per year    Active Member of [employer] or Organizations: No    Attends Banker Meetings: Never    Marital Status: Married  Catering manager Violence: Not At Risk (12/08/2022)   Received from [facility]   Humiliation, Afraid, Rape, and Kick questionnaire    Fear of Current or Ex-Partner: No    Emotionally Abused: No    Physically Abused: No    Sexually Abused: No    Review of  Systems Constitutional: Patient denies any unintentional weight loss or change in strength lntegumentary: Patient denies any rashes or pruritus Cardiovascular: Patient denies chest pain or syncope Respiratory: Patient denies shortness of breath Gastrointestinal: Patient reports infrequent dry mouth or constipation  Musculoskeletal: Patient denies muscle cramps or weakness Neurologic: Patient denies convulsions or seizures Allergic/Immunologic: Patient denies recent allergic reaction(s) Hematologic/Lymphatic: Patient denies bleeding tendencies Endocrine: Patient denies heat/cold intolerance  GU: As per HPI.  OBJECTIVE Vitals:   06/27/23 1622  BP: 124/74  Pulse: 76   There is no height or weight on file to calculate BMI.  Physical Examination Constitutional: No  obvious distress; patient is non-toxic appearing  Cardiovascular: No visible lower extremity edema.  Respiratory: The patient does not have audible wheezing/stridor; respirations do not appear labored  Gastrointestinal: Abdomen non-distended Musculoskeletal: Normal ROM of UEs  Skin: No obvious rashes/open sores  Neurologic: CN 2-12 grossly intact Psychiatric: Answered questions appropriately with normal affect  Hematologic/Lymphatic/Immunologic: No obvious bruises or sites of spontaneous bleeding  UA: unremarkable PVR: 0 ml  ASSESSMENT OAB (overactive bladder) - Plan: Urinalysis, Routine w reflex microscopic, BLADDER SCAN AMB NON-IMAGING  Benign prostatic hyperplasia with urinary frequency  Nocturia  Benign localized prostatic hyperplasia with lower urinary tract symptoms (LUTS)  OAB symptoms are improved on Vesicare  (Solifenacin ) 10 mg daily with no significant identified side effects; patient elected to continue that.   Advised to continue Uroxatral  and Proscar  for BPH.  Will plan for follow up as previously scheduled with Dr. Claretta Croft on 01/12/2024. Pt verbalized understanding and agreement. All questions were  answered.   PLAN Advised the following: 1. Continue Uroxatrol (Alfuzosin ) 10 mg daily. 2. Continue Proscar  (Finasteride ) 5 mg daily. 3. Continue Vesicare  (Solifenacin ) 10 mg daily. 4. Return in 7 months (on 01/12/2024) for as previously scheduled, f/u with Dr. Claretta Croft.  Orders Placed This Encounter  Procedures   Urinalysis, Routine w reflex microscopic   BLADDER SCAN AMB NON-IMAGING    It has been explained that the patient is to follow regularly with their PCP in addition to all other providers involved in their care and to follow instructions provided by these respective offices. Patient advised to contact urology clinic if any urologic-pertaining questions, concerns, new symptoms or problems arise in the interim period.  There are no Patient Instructions on file for this visit.  Electronically signed by:  Lauretta Ponto, FNP   06/27/23    5:06 PM

## 2023-06-28 LAB — URINALYSIS, ROUTINE W REFLEX MICROSCOPIC
Bilirubin, UA: NEGATIVE
Glucose, UA: NEGATIVE
Leukocytes,UA: NEGATIVE
Nitrite, UA: NEGATIVE
RBC, UA: NEGATIVE
Specific Gravity, UA: 1.02 (ref 1.005–1.030)
Urobilinogen, Ur: 1 mg/dL (ref 0.2–1.0)
pH, UA: 6.5 (ref 5.0–7.5)

## 2023-07-11 ENCOUNTER — Encounter: Payer: Self-pay | Admitting: Nurse Practitioner

## 2023-07-11 ENCOUNTER — Ambulatory Visit: Payer: Medicare HMO | Admitting: Nurse Practitioner

## 2023-07-11 VITALS — BP 118/72 | HR 62 | Ht 71.0 in | Wt 182.0 lb

## 2023-07-11 DIAGNOSIS — E782 Mixed hyperlipidemia: Secondary | ICD-10-CM | POA: Diagnosis not present

## 2023-07-11 DIAGNOSIS — E1122 Type 2 diabetes mellitus with diabetic chronic kidney disease: Secondary | ICD-10-CM | POA: Diagnosis not present

## 2023-07-11 DIAGNOSIS — N1831 Chronic kidney disease, stage 3a: Secondary | ICD-10-CM

## 2023-07-11 DIAGNOSIS — Z7984 Long term (current) use of oral hypoglycemic drugs: Secondary | ICD-10-CM

## 2023-07-11 DIAGNOSIS — I10 Essential (primary) hypertension: Secondary | ICD-10-CM

## 2023-07-11 MED ORDER — ONETOUCH SURESOFT LANCING DEV MISC: 0 refills | Status: DC

## 2023-07-11 MED ORDER — ONETOUCH VERIO VI STRP: ORAL_STRIP | 3 refills | Status: DC

## 2023-07-11 MED ORDER — METFORMIN HCL 500 MG PO TABS: 500.0000 mg | ORAL_TABLET | Freq: Every day | ORAL | 3 refills | Status: AC

## 2023-07-11 MED ORDER — ONETOUCH DELICA PLUS LANCET30G MISC: 3 refills | Status: DC

## 2023-07-11 MED ORDER — GLIPIZIDE ER 5 MG PO TB24: 5.0000 mg | ORAL_TABLET | Freq: Every day | ORAL | 3 refills | Status: DC

## 2023-07-11 NOTE — Progress Notes (Signed)
 07/11/2023                    Endocrinology follow-up note    Subjective:    Patient ID: Darryl Mayer, male    DOB: 12/13/1955. Patient is being seen in follow-up  for management of diabetes requested by  Avelina Bode, DO  Past Medical History:  Diagnosis Date   Diabetes mellitus, type II (HCC)    Hyperlipidemia    Hypertension    Past Surgical History:  Procedure Laterality Date   HEMORROIDECTOMY     Social History   Socioeconomic History   Marital status: Married    Spouse name: Not on file   Number of children: Not on file   Years of education: Not on file   Highest education level: Not on file  Occupational History   Not on file  Tobacco Use   Smoking status: Never   Smokeless tobacco: Never  Vaping Use   Vaping status: Never Used  Substance and Sexual Activity   Alcohol use: Not on file   Drug use: No   Sexual activity: Not on file  Other Topics Concern   Not on file  Social History Narrative   Not on file   Social Drivers of Health   Financial Resource Strain: Low Risk  (12/08/2022)   Received from [facility]   Overall Financial Resource Strain (CARDIA)    Difficulty of Paying Living Expenses: Not very hard  Food Insecurity: No Food Insecurity (12/08/2022)   Received from [facility]   Hunger Vital Sign    Worried About Running Out of Food in the Last Year: Never true    Ran Out of Food in the Last Year: Never true  Transportation Needs: No Transportation Needs (12/08/2022)   Received from [facility] - Transportation    Lack of Transportation (Medical): No    Lack of Transportation (Non-Medical): No  Physical Activity: Insufficiently Active (12/08/2022)   Received from [facility]   Exercise Vital Sign    Days of Exercise per Week: 3 days    Minutes of Exercise per Session: 40 min  Stress: No Stress Concern Present (12/08/2022)   Received from [facility] -  Occupational Stress Questionnaire    Feeling of Stress : Only a [place]  Social Connections: Moderately Integrated (12/08/2022)   Received from [facility]   Social Connection and Isolation Panel [NHANES]    Frequency of Communication with Friends and Family: More than three times a week    Frequency of Social Gatherings with Friends and Family: More than three times a week    Attends Religious Services: 1 to 4 times per year    Active Member of [employer] or Organizations: No    Attends Banker Meetings: Never    Marital Status: Married   Outpatient Encounter Medications as of 07/11/2023  Medication Sig   alfuzosin  (UROXATRAL ) 10 MG 24 hr tablet Take 1 tablet (10 mg total) by mouth at bedtime.   aspirin 81 MG EC tablet Take by mouth.   Blood Glucose Monitoring Suppl (ONETOUCH VERIO) w/Device KIT Use as instructed to test blood glucose twice daily   finasteride  (PROSCAR ) 5 MG tablet Take 1 tablet (5 mg total) by mouth daily.   finasteride  (PROSCAR ) 5 MG tablet Take 1 tablet (5 mg total) by mouth daily.   latanoprost (XALATAN) 0.005 % ophthalmic solution SMARTSIG:1 In Eye(s)  Every Evening   lisinopril-hydrochlorothiazide (PRINZIDE,ZESTORETIC) 20-12.5 MG tablet Take 2 tablets by mouth daily.   NIFEdipine (PROCARDIA XL/NIFEDICAL-XL) 90 MG 24 hr tablet TAKE 1 TABLET BY MOUTH EVERY DAY   solifenacin  (VESICARE ) 5 MG tablet Take 1 tablet (5 mg total) by mouth daily.   [DISCONTINUED] glipiZIDE  (GLUCOTROL  XL) 5 MG 24 hr tablet Take 1 tablet (5 mg total) by mouth daily with breakfast.   [DISCONTINUED] glucose blood (ONETOUCH VERIO) test strip Use to monitor blood glucose once daily as directed.   [DISCONTINUED] Lancets (ONETOUCH DELICA PLUS LANCET30G) MISC USE TO CHECK BLOOD SUGAR TWICE DAILY. E11.65   [DISCONTINUED] Lancets Misc. (ONE TOUCH SURESOFT) MISC Use to check glucose twice daily   [DISCONTINUED] metFORMIN  (GLUCOPHAGE ) 500 MG tablet Take 1 tablet (500 mg total) by mouth daily with  breakfast.   atorvastatin (LIPITOR) 10 MG tablet Take 10 mg by mouth daily. (Patient not taking: Reported on 07/11/2023)   baclofen (LIORESAL) 10 MG tablet Take 10 mg by mouth 3 (three) times daily as needed. (Patient not taking: Reported on 07/11/2023)   glipiZIDE  (GLUCOTROL  XL) 5 MG 24 hr tablet Take 1 tablet (5 mg total) by mouth daily with breakfast.   glucose blood (ONETOUCH VERIO) test strip Use to monitor blood glucose once daily as directed.   ketoconazole (NIZORAL) 2 % shampoo Apply topically 2 (two) times a week. (Patient not taking: Reported on 07/11/2023)   Lancets (ONETOUCH DELICA PLUS LANCET30G) MISC USE TO CHECK BLOOD SUGAR TWICE DAILY. E11.65   Lancets Misc. (ONE TOUCH SURESOFT) MISC Use to check glucose twice daily   metFORMIN  (GLUCOPHAGE ) 500 MG tablet Take 1 tablet (500 mg total) by mouth daily with breakfast.   potassium chloride (KLOR-CON) 10 MEQ tablet Take by mouth. (Patient not taking: Reported on 07/11/2023)   tadalafil (CIALIS) 5 MG tablet Take 5 mg by mouth daily as needed. (Patient not taking: Reported on 07/11/2023)   No facility-administered encounter medications on file as of 07/11/2023.   ALLERGIES: No Known Allergies VACCINATION STATUS: Immunization History  Administered Date(s) Administered   PFIZER(Purple Top)SARS-COV-2 Vaccination 11/25/2019    Diabetes He presents for his follow-up diabetic visit. He has type 2 diabetes mellitus. Onset time: He was diagnosed at approximate age of 38 years. His disease course has been improving. There are no hypoglycemic associated symptoms. Pertinent negatives for hypoglycemia include no confusion, headaches, pallor or seizures. There are no diabetic associated symptoms. Pertinent negatives for diabetes include no chest pain, no fatigue, no polydipsia, no polyphagia, no polyuria and no weakness. There are no hypoglycemic complications. Symptoms are stable. Diabetic complications include impotence and nephropathy. Risk factors for  coronary artery disease include diabetes mellitus, dyslipidemia, male sex, hypertension and sedentary lifestyle. Current diabetic treatment includes oral agent (dual therapy). He is compliant with treatment all of the time. His weight is fluctuating minimally. He is following a generally healthy diet. When asked about meal planning, he reported none. He has not had a previous visit with a dietitian. He participates in exercise intermittently (has not been as active during the winter months). His home blood glucose trend is decreasing steadily. His breakfast blood glucose range is generally 140-180 mg/dl. His bedtime blood glucose range is generally 140-180 mg/dl. His overall blood glucose range is 140-180 mg/dl. (He presents today with his meter and logs showing improved, at goal glycemic profile overall.  His POCT A1c today is 7.5%,  improving from last visit of 7.8%.  He denies any hypoglycemia.  Analysis of his meter shows  7-day average of 144, 14-day average of 145, 30-day average of 148, 90-day average of 155.  He has been more active recently with yard work.  ) An ACE inhibitor/angiotensin II receptor blocker is being taken. He does not see a podiatrist.Eye exam is current.  Hyperlipidemia This is a chronic problem. The current episode started more than 1 year ago. The problem is controlled. Recent lipid tests were reviewed and are normal. Exacerbating diseases include chronic renal disease and diabetes. Factors aggravating his hyperlipidemia include thiazides. Pertinent negatives include no chest pain, myalgias or shortness of breath. Current antihyperlipidemic treatment includes statins. The current treatment provides moderate improvement of lipids. There are no compliance problems.  Risk factors for coronary artery disease include diabetes mellitus, dyslipidemia, hypertension, male sex and a sedentary lifestyle.  Hypertension This is a chronic problem. The current episode started more than 1 year ago.  The problem has been resolved since onset. The problem is controlled. Pertinent negatives include no chest pain, headaches, neck pain, palpitations or shortness of breath. There are no associated agents to hypertension. Risk factors for coronary artery disease include dyslipidemia, diabetes mellitus, male gender and sedentary lifestyle. Past treatments include diuretics and ACE inhibitors. The current treatment provides moderate improvement. There are no compliance problems.  Hypertensive end-organ damage includes kidney disease. Identifiable causes of hypertension include chronic renal disease.    Review of systems  Constitutional: + Minimally fluctuating body weight,  current Body mass index is 25.38 kg/m. , no fatigue, no subjective hyperthermia, no subjective hypothermia Eyes: no blurry vision, no xerophthalmia ENT: no sore throat, no nodules palpated in throat, no dysphagia/odynophagia, no hoarseness Cardiovascular: no chest pain, no shortness of breath, no palpitations, no leg swelling Respiratory: no cough, no shortness of breath Gastrointestinal: no nausea/vomiting/diarrhea Musculoskeletal: no muscle/joint aches Skin: no rashes, no hyperemia Neurological: no tremors, no numbness, no tingling, no dizziness Psychiatric: no depression, no anxiety   Objective:    BP 118/72 (BP Location: Left Arm, Patient Position: Sitting, Cuff Size: Large)   Pulse 62   Ht 5\' 11"  (1.803 m)   Wt 182 lb (82.6 kg)   BMI 25.38 kg/m   Wt Readings from Last 3 Encounters:  07/11/23 182 lb (82.6 kg)  03/07/23 183 lb 6.4 oz (83.2 kg)  11/03/22 179 lb 12.8 oz (81.6 kg)    BP Readings from Last 3 Encounters:  07/11/23 118/72  06/27/23 124/74  05/03/23 134/78     Physical Exam- Limited  Constitutional:  Body mass index is 25.38 kg/m. , not in acute distress, normal state of mind Eyes:  EOMI, no exophthalmos Musculoskeletal: no gross deformities, strength intact in all four extremities, no gross  restriction of joint movements Skin:  no rashes, no hyperemia Neurological: no tremor with outstretched hands   Diabetic Foot Exam - Simple   No data filed    Recent Results (from the past 2160 hours)  Urinalysis, Routine w reflex microscopic     Status: Abnormal   Collection Time: 05/03/23 10:53 AM  Result Value Ref Range   Specific Gravity, UA 1.010 1.005 - 1.030   pH, UA 6.0 5.0 - 7.5   Color, UA Yellow Yellow   Appearance Ur Clear Clear   Leukocytes,UA Negative Negative   Protein,UA Negative Negative/Trace   Glucose, UA 2+ (A) Negative   Ketones, UA Negative Negative   RBC, UA Trace (A) Negative   Bilirubin, UA Negative Negative   Urobilinogen, Ur 0.2 0.2 - 1.0 mg/dL   Nitrite, UA Negative  Negative   Microscopic Examination See below:     Comment: Microscopic was indicated and was performed.  Microscopic Examination     Status: None   Collection Time: 05/03/23 10:53 AM   Urine  Result Value Ref Range   WBC, UA 0-5 0 - 5 /hpf   RBC, Urine 0-2 0 - 2 /hpf   Epithelial Cells (non renal) 0-10 0 - 10 /hpf   Bacteria, UA None seen None seen/Few  Urinalysis, Routine w reflex microscopic     Status: Abnormal   Collection Time: 06/27/23  4:15 PM  Result Value Ref Range   Specific Gravity, UA 1.020 1.005 - 1.030   pH, UA 6.5 5.0 - 7.5   Color, UA Yellow Yellow   Appearance Ur Clear Clear   Leukocytes,UA Negative Negative   Protein,UA Trace (A) Negative/Trace   Glucose, UA Negative Negative   Ketones, UA Trace (A) Negative   RBC, UA Negative Negative   Bilirubin, UA Negative Negative   Urobilinogen, Ur 1.0 0.2 - 1.0 mg/dL   Nitrite, UA Negative Negative   Microscopic Examination Comment     Comment: Microscopic not indicated and not performed.         Assessment & Plan:   1) Uncontrolled type 2 diabetes mellitus with complication, without long-term current use of insulin (HCC)  He presents today with his meter and logs showing improved, at goal glycemic profile  overall.  His POCT A1c today is 7.5%,  improving from last visit of 7.8%.  He denies any hypoglycemia.  Analysis of his meter shows 7-day average of 144, 14-day average of 145, 30-day average of 148, 90-day average of 155.  He has been more active recently with yard work.    - His diabetes is complicated by CKD, however patient remains at a high risk for more acute and chronic complications of diabetes which include CAD, CVA, CKD, retinopathy, and neuropathy. These are all discussed in detail with the patient.  - Nutritional counseling repeated at each appointment due to patients tendency to fall back in to old habits.  - The patient admits there is a room for improvement in their diet and drink choices. -  Suggestion is made for the patient to avoid simple carbohydrates from their diet including Cakes, Sweet Desserts / Pastries, Ice Cream, Soda (diet and regular), Sweet Tea, Candies, Chips, Cookies, Sweet Pastries, Store Bought Juices, Alcohol in Excess of 1-2 drinks a day, Artificial Sweeteners, Coffee Creamer, and "Sugar-free" Products. This will help patient to have stable blood glucose profile and potentially avoid unintended weight gain.   - I encouraged the patient to switch to unprocessed or minimally processed complex starch and increased protein intake (animal or plant source), fruits, and vegetables.   - Patient is advised to stick to a routine mealtimes to eat 3 meals a day and avoid unnecessary snacks (to snack only to correct hypoglycemia).  - I have approached patient with the following individualized plan to manage diabetes and patient agrees:   -Based on his stable glycemic profile, he is advised to continue current medication regimen of Metformin  500 mg po daily with breakfast and Glipizide  5 mg XL daily with breakfast.   -He is encouraged to continue monitoring blood glucose at least once daily, before breakfast and to call the clinic if his readings are less than 70 or greater  than 200 for 3 tests in a row.  - Patient specific target  A1c;  LDL, HDL, Triglycerides, and  Waist Circumference  were discussed in detail.  2) BP/HTN:  His blood pressure is controlled to target.  He is advised to continue Lisinopril-HCT 20-12.5 mg po daily.   3) Lipids/HPL:  His most recent lipid panel from 06/16/22 shows controlled LDL at 47.  He is advised to continue Lipitor 10 mg po daily at bedtime.  Side effects and precautions discussed with him.  Will recheck lipid panel prior to next visit.  4)  Weight/Diet:  His Body mass index is 25.38 kg/m.-- not a candidate for major weight loss.  CDE Consult has been , exercise, and detailed carbohydrates information provided.  5) Hypocalcemia-1st notice on 09/23/19.   His most recent Calcium level is WNL at 8.9-stable.  He will need repeat measurements of calcium on subsequent visits.   6) Chronic Care/Health Maintenance: -Patient is on ACEI/ARB and Statin medications and encouraged to continue to follow up with Ophthalmology, Podiatrist at least yearly or according to recommendations, and advised to   stay away from smoking. I have recommended yearly flu vaccine and pneumonia vaccination at least every 5 years; moderate intensity exercise for up to 150 minutes weekly; and  sleep for at least 7 hours a day.  - I advised patient to maintain close follow up with Avelina Bode, DO for primary care needs.     I spent  48  minutes in the care of the patient today including review of labs from CMP, Lipids, Thyroid Function, Hematology (current and previous including abstractions from other facilities); face-to-face time discussing  his blood glucose readings/logs, discussing hypoglycemia and hyperglycemia episodes and symptoms, medications doses, his options of short and long term treatment based on the latest standards of care / guidelines;  discussion about incorporating lifestyle medicine;  and documenting the encounter. Risk reduction  counseling performed per USPSTF guidelines to reduce obesity and cardiovascular risk factors.     Please refer to Patient Instructions for Blood Glucose Monitoring and Insulin/Medications Dosing Guide"  in media tab for additional information. Please  also refer to " Patient Self Inventory" in the Media  tab for reviewed elements of pertinent patient history.  Naol Sidle participated in the discussions, expressed understanding, and voiced agreement with the above plans.  All questions were answered to his satisfaction. he is encouraged to contact clinic should he have any questions or concerns prior to his return visit.   Follow up plan: - Return in about 4 months (around 11/10/2023) for Diabetes F/U with A1c in office, Previsit labs, Bring meter and logs.  Hulon Magic, [facility] [address] [place], [place] [address] Phone: [phone] Fax: [phone]  07/11/2023, 8:57 AM

## 2023-07-24 ENCOUNTER — Other Ambulatory Visit: Payer: Self-pay | Admitting: Nurse Practitioner

## 2023-07-24 DIAGNOSIS — N1831 Chronic kidney disease, stage 3a: Secondary | ICD-10-CM

## 2023-07-26 ENCOUNTER — Other Ambulatory Visit: Payer: Self-pay | Admitting: Urology

## 2023-07-26 DIAGNOSIS — N3281 Overactive bladder: Secondary | ICD-10-CM

## 2023-07-27 ENCOUNTER — Other Ambulatory Visit: Payer: Self-pay

## 2023-07-27 DIAGNOSIS — E1122 Type 2 diabetes mellitus with diabetic chronic kidney disease: Secondary | ICD-10-CM

## 2023-07-27 DIAGNOSIS — N1831 Chronic kidney disease, stage 3a: Secondary | ICD-10-CM

## 2023-07-27 MED ORDER — ONETOUCH VERIO VI STRP: ORAL_STRIP | 3 refills | Status: DC

## 2023-11-15 ENCOUNTER — Ambulatory Visit: Admitting: Nurse Practitioner

## 2023-11-15 ENCOUNTER — Encounter: Payer: Self-pay | Admitting: Nurse Practitioner

## 2023-11-15 VITALS — BP 108/70 | HR 62 | Ht 71.0 in | Wt 180.6 lb

## 2023-11-15 DIAGNOSIS — N1831 Chronic kidney disease, stage 3a: Secondary | ICD-10-CM | POA: Diagnosis not present

## 2023-11-15 DIAGNOSIS — E782 Mixed hyperlipidemia: Secondary | ICD-10-CM

## 2023-11-15 DIAGNOSIS — E1122 Type 2 diabetes mellitus with diabetic chronic kidney disease: Secondary | ICD-10-CM

## 2023-11-15 DIAGNOSIS — Z7984 Long term (current) use of oral hypoglycemic drugs: Secondary | ICD-10-CM

## 2023-11-15 DIAGNOSIS — I10 Essential (primary) hypertension: Secondary | ICD-10-CM | POA: Diagnosis not present

## 2023-11-15 LAB — POCT GLYCOSYLATED HEMOGLOBIN (HGB A1C): Hemoglobin A1C: 8.1 % — AB (ref 4.0–5.6)

## 2023-11-15 MED ORDER — ONETOUCH SURESOFT LANCING DEV MISC: 0 refills | Status: AC

## 2023-11-15 MED ORDER — ONETOUCH DELICA PLUS LANCET30G MISC: 3 refills | Status: AC

## 2023-11-15 MED ORDER — ONETOUCH VERIO VI STRP: ORAL_STRIP | 3 refills | Status: DC

## 2023-11-15 NOTE — Progress Notes (Signed)
 11/15/2023                    Endocrinology follow-up note    Subjective:    Patient ID: Darryl Mayer, male    DOB: [birth year]. Patient is being seen in follow-up for management of diabetes requested by  Tobie Guy, DO  Past Medical History:  Diagnosis Date   Diabetes mellitus, type II (HCC)    Hyperlipidemia    Hypertension    Past Surgical History:  Procedure Laterality Date   HEMORROIDECTOMY     Social History   Socioeconomic History   Marital status: Married    Spouse name: Not on file   Number of children: Not on file   Years of education: Not on file   Highest education level: Not on file  Occupational History   Not on file  Tobacco Use   Smoking status: Never   Smokeless tobacco: Never  Vaping Use   Vaping status: Never Used  Substance and Sexual Activity   Alcohol use: Not on file   Drug use: No   Sexual activity: Not on file  Other Topics Concern   Not on file  Social History Narrative   Not on file   Social Drivers of Health   Financial Resource Strain: Low Risk  (12/08/2022)   Received from [facility]   Overall Financial Resource Strain (CARDIA)    Difficulty of Paying Living Expenses: Not very hard  Food Insecurity: No Food Insecurity (12/08/2022)   Received from [facility]   Hunger Vital Sign    Within the past 12 months, you worried that your food would run out before you got the money to buy more.: Never true    Within the past 12 months, the food you bought just didn't last and you didn't have money to get more.: Never true  Transportation Needs: No Transportation Needs (12/08/2022)   Received from [facility] - Transportation    Lack of Transportation (Medical): No    Lack of Transportation (Non-Medical): No  Physical Activity: Insufficiently Active (12/08/2022)   Received from [facility]   Exercise Vital Sign    On average, how many days per week do you engage in moderate to strenuous exercise (like a  brisk walk)?: 3 days    On average, how many minutes do you engage in exercise at this level?: 40 min  Stress: No Stress Concern Present (12/08/2022)   Received from [facility] - Occupational Stress Questionnaire    Feeling of Stress : Only a Gerald  Social Connections: Moderately Integrated (12/08/2022)   Received from [facility]   Social Connection and Isolation Panel    In a typical week, how many times do you talk on the phone with family, friends, or neighbors?: More than three times a week    How often do you get together with friends or relatives?: More than three times a week    How often do you attend church or religious services?: 1 to 4 times per year    Do you belong to any clubs or organizations such as church groups, unions, fraternal or athletic groups, or school groups?: No    How often do you attend meetings of the clubs or organizations you belong to?: Never    Are you married, widowed, divorced, separated, never married, or living with a partner?: Married   Outpatient Encounter Medications as  of 11/15/2023  Medication Sig   alfuzosin  (UROXATRAL ) 10 MG 24 hr tablet Take 1 tablet (10 mg total) by mouth at bedtime.   aspirin 81 MG EC tablet Take by mouth.   Blood Glucose Monitoring Suppl (ONETOUCH VERIO) w/Device KIT Use as instructed to test blood glucose twice daily   finasteride  (PROSCAR ) 5 MG tablet Take 1 tablet (5 mg total) by mouth daily.   finasteride  (PROSCAR ) 5 MG tablet Take 1 tablet (5 mg total) by mouth daily.   glipiZIDE  (GLUCOTROL  XL) 5 MG 24 hr tablet Take 1 tablet (5 mg total) by mouth daily with breakfast.   latanoprost (XALATAN) 0.005 % ophthalmic solution SMARTSIG:1 In Eye(s) Every Evening   lisinopril-hydrochlorothiazide (PRINZIDE,ZESTORETIC) 20-12.5 MG tablet Take 2 tablets by mouth daily.   metFORMIN  (GLUCOPHAGE ) 500 MG tablet Take 1 tablet (500 mg total) by mouth daily with breakfast.   NIFEdipine  (PROCARDIA XL/NIFEDICAL-XL) 90 MG 24 hr tablet TAKE 1 TABLET BY MOUTH EVERY DAY   solifenacin  (VESICARE ) 5 MG tablet TAKE 1 TABLET (5 MG TOTAL) BY MOUTH DAILY.   [DISCONTINUED] glucose blood (ONETOUCH VERIO) test strip Use to monitor blood glucose twice daily as directed by provider.   [DISCONTINUED] Lancets (ONETOUCH DELICA PLUS LANCET30G) MISC USE TO CHECK BLOOD SUGAR TWICE DAILY. E11.65   [DISCONTINUED] Lancets Misc. (ONE TOUCH SURESOFT) MISC Use to check glucose twice daily   glucose blood (ONETOUCH VERIO) test strip Use to monitor blood glucose twice daily as directed by provider.   ketoconazole (NIZORAL) 2 % shampoo Apply topically 2 (two) times a week. (Patient not taking: Reported on 11/15/2023)   Lancets (ONETOUCH DELICA PLUS LANCET30G) MISC USE TO CHECK BLOOD SUGAR TWICE DAILY. E11.65   Lancets Misc. (ONE TOUCH SURESOFT) MISC Use to check glucose twice daily   potassium chloride (KLOR-CON) 10 MEQ tablet Take by mouth. (Patient not taking: Reported on 11/15/2023)   tadalafil (CIALIS) 5 MG tablet Take 5 mg by mouth daily as needed. (Patient not taking: Reported on 11/15/2023)   [DISCONTINUED] atorvastatin (LIPITOR) 10 MG tablet Take 10 mg by mouth daily. (Patient not taking: Reported on 11/15/2023)   [DISCONTINUED] baclofen (LIORESAL) 10 MG tablet Take 10 mg by mouth 3 (three) times daily as needed. (Patient not taking: Reported on 11/15/2023)   No facility-administered encounter medications on file as of 11/15/2023.   ALLERGIES: No Known Allergies VACCINATION STATUS: Immunization History  Administered Date(s) Administered   PFIZER(Purple Top)SARS-COV-2 Vaccination 11/25/2019    Diabetes He presents for his follow-up diabetic visit. He has type 2 diabetes mellitus. Onset time: He was diagnosed at approximate age of 38 years. His disease course has been improving. There are no hypoglycemic associated symptoms. Pertinent negatives for hypoglycemia include no confusion, pallor or seizures.  There are no diabetic associated symptoms. Pertinent negatives for diabetes include no fatigue, no polydipsia, no polyphagia, no polyuria and no weakness. There are no hypoglycemic complications. Symptoms are stable. Diabetic complications include impotence and nephropathy. Risk factors for coronary artery disease include diabetes mellitus, dyslipidemia, male sex, hypertension and sedentary lifestyle. Current diabetic treatment includes oral agent (dual therapy). He is compliant with treatment all of the time. His weight is fluctuating minimally. He is following a generally healthy diet. When asked about meal planning, he reported none. He has not had a previous visit with a dietitian. He participates in exercise intermittently (has not been as active during the winter months). His home blood glucose trend is decreasing steadily. His breakfast blood glucose range is generally 140-180 mg/dl.  His bedtime blood glucose range is generally 140-180 mg/dl. His overall blood glucose range is 140-180 mg/dl. (He presents today with his meter and logs showing stable, mostly at goal glycemic profile overall.  His POCT A1c today is 8.1%,  increasing from last visit of 7.5%.  He did have 1 episode of hypoglycemia. He has not been as active lately due to yard work winding down. ) An ACE inhibitor/angiotensin II receptor blocker is being taken. He does not see a podiatrist.Eye exam is current.    Review of systems  Constitutional: + Minimally fluctuating body weight,  current Body mass index is 25.19 kg/m. , no fatigue, no subjective hyperthermia, no subjective hypothermia Eyes: no blurry vision, no xerophthalmia ENT: no sore throat, no nodules palpated in throat, no dysphagia/odynophagia, no hoarseness Cardiovascular: no chest pain, no shortness of breath, no palpitations, no leg swelling Respiratory: no cough, no shortness of breath Gastrointestinal: no nausea/vomiting/diarrhea Musculoskeletal: no muscle/joint  aches Skin: no rashes, no hyperemia Neurological: no tremors, no numbness, no tingling, no dizziness Psychiatric: no depression, no anxiety   Objective:    BP 108/70 (BP Location: Left Arm, Patient Position: Sitting, Cuff Size: Large)   Pulse 62   Ht 5' 11 (1.803 m)   Wt 180 lb 9.6 oz (81.9 kg)   BMI 25.19 kg/m   Wt Readings from Last 3 Encounters:  11/15/23 180 lb 9.6 oz (81.9 kg)  07/11/23 182 lb (82.6 kg)  03/07/23 183 lb 6.4 oz (83.2 kg)    BP Readings from Last 3 Encounters:  11/15/23 108/70  07/11/23 118/72  06/27/23 124/74      Physical Exam- Limited  Constitutional:  Body mass index is 25.19 kg/m. , not in acute distress, normal state of mind Eyes:  EOMI, no exophthalmos Musculoskeletal: no gross deformities, strength intact in all four extremities, no gross restriction of joint movements Skin:  no rashes, no hyperemia Neurological: no tremor with outstretched hands   Diabetic Foot Exam - Simple   Simple Foot Form Diabetic Foot exam was performed with the following findings: Yes 11/15/2023  8:42 AM  Visual Inspection No deformities, no ulcerations, no other skin breakdown bilaterally: Yes Sensation Testing Intact to touch and monofilament testing bilaterally: Yes Pulse Check Posterior Tibialis and Dorsalis pulse intact bilaterally: Yes Comments    Recent Results (from the past 2160 hours)  HgB A1c     Status: Abnormal   Collection Time: 11/15/23  8:39 AM  Result Value Ref Range   Hemoglobin A1C 8.1 (A) 4.0 - 5.6 %   HbA1c POC (<> result, manual entry)     HbA1c, POC (prediabetic range)     HbA1c, POC (controlled diabetic range)         Assessment & Plan:   1) Controlled type 2 diabetes mellitus with complication, without long-term current use of insulin (HCC)  He presents today with his meter and logs showing stable, mostly at goal glycemic profile overall.  His POCT A1c today is 8.1%,  increasing from last visit of 7.5%.  He did have 1  episode of hypoglycemia. He has not been as active lately due to yard work winding down.   - His diabetes is complicated by CKD, however patient remains at a high risk for more acute and chronic complications of diabetes which include CAD, CVA, CKD, retinopathy, and neuropathy. These are all discussed in detail with the patient.  - Nutritional counseling repeated at each appointment due to patients tendency to fall back in to old  habits.  - The patient admits there is a room for improvement in their diet and drink choices. -  Suggestion is made for the patient to avoid simple carbohydrates from their diet including Cakes, Sweet Desserts / Pastries, Ice Cream, Soda (diet and regular), Sweet Tea, Candies, Chips, Cookies, Sweet Pastries, Store Bought Juices, Alcohol in Excess of 1-2 drinks a day, Artificial Sweeteners, Coffee Creamer, and Sugar-free Products. This will help patient to have stable blood glucose profile and potentially avoid unintended weight gain.   - I encouraged the patient to switch to unprocessed or minimally processed complex starch and increased protein intake (animal or plant source), fruits, and vegetables.   - Patient is advised to stick to a routine mealtimes to eat 3 meals a day and avoid unnecessary snacks (to snack only to correct hypoglycemia).  - I have approached patient with the following individualized plan to manage diabetes and patient agrees:    -Based on his stable glycemic profile, he is advised to continue current medication regimen of Metformin  500 mg po daily with breakfast and Glipizide  5 mg XL daily with breakfast.  -He is encouraged to continue monitoring blood glucose at least once daily, before breakfast and to call the clinic if his readings are less than 70 or greater than 200 for 3 tests in a row.  - Patient specific target  A1c;  LDL, HDL, Triglycerides, and  Waist Circumference were discussed in detail.  2) BP/HTN:  His blood pressure is  controlled to target.  He is advised to continue meds as prescribed by PCP.  He is asking if he can take just 1 of his BP pills instead of 2 a day.  Given tight BP reading today, I thought that was a reasonable request.  I did notify him to keep eye on BP at home and notify me or PCP if BP above 130/80.  3) Lipids/HPL:  His most recent lipid panel from 11/08/23 shows controlled LDL at 57.  He is advised to continue Lipitor 10 mg po daily at bedtime.  Side effects and precautions discussed with him.    4)  Weight/Diet:  His Body mass index is 25.19 kg/m.-- not a candidate for major weight loss.  CDE Consult has been , exercise, and detailed carbohydrates information provided.  5) Hypocalcemia-1st notice on 09/23/19.   His most recent Calcium level is WNL at 8.9-stable.  He will need repeat measurements of calcium on subsequent visits.   6) Chronic Care/Health Maintenance: -Patient is on ACEI/ARB and Statin medications and encouraged to continue to follow up with Ophthalmology, Podiatrist at least yearly or according to recommendations, and advised to   stay away from smoking. I have recommended yearly flu vaccine and pneumonia vaccination at least every 5 years; moderate intensity exercise for up to 150 minutes weekly; and  sleep for at least 7 hours a day.  - I advised patient to maintain close follow up with Tobie Guy, DO for primary care needs.     I spent  47  minutes in the care of the patient today including review of labs from CMP, Lipids, Thyroid Function, Hematology (current and previous including abstractions from other facilities); face-to-face time discussing  his blood glucose readings/logs, discussing hypoglycemia and hyperglycemia episodes and symptoms, medications doses, his options of short and long term treatment based on the latest standards of care / guidelines;  discussion about incorporating lifestyle medicine;  and documenting the encounter. Risk reduction counseling  performed per USPSTF guidelines to  reduce obesity and cardiovascular risk factors.     Please refer to Patient Instructions for Blood Glucose Monitoring and Insulin/Medications Dosing Guide  in media tab for additional information. Please  also refer to  Patient Self Inventory in the Media  tab for reviewed elements of pertinent patient history.  Darryl Mayer participated in the discussions, expressed understanding, and voiced agreement with the above plans.  All questions were answered to his satisfaction. he is encouraged to contact clinic should he have any questions or concerns prior to his return visit.   Follow up plan: - Return in about 4 months (around 03/17/2024) for Diabetes F/U with A1c in office, No previsit labs, Bring meter and logs.  Benton Rio, [facility] [address] [place], [place] [address] Phone: [phone] Fax: [phone]  11/15/2023, 8:47 AM

## 2023-11-21 ENCOUNTER — Other Ambulatory Visit: Payer: Self-pay | Admitting: *Deleted

## 2023-11-21 DIAGNOSIS — E1122 Type 2 diabetes mellitus with diabetic chronic kidney disease: Secondary | ICD-10-CM

## 2023-11-21 MED ORDER — ONETOUCH VERIO VI STRP: ORAL_STRIP | 3 refills | Status: AC

## 2023-12-15 ENCOUNTER — Other Ambulatory Visit: Payer: Self-pay | Admitting: Nurse Practitioner

## 2024-01-12 ENCOUNTER — Encounter: Payer: Self-pay | Admitting: Urology

## 2024-01-12 ENCOUNTER — Ambulatory Visit: Payer: Medicare HMO | Admitting: Urology

## 2024-01-12 VITALS — BP 147/69 | HR 64

## 2024-01-12 DIAGNOSIS — N138 Other obstructive and reflux uropathy: Secondary | ICD-10-CM

## 2024-01-12 DIAGNOSIS — R351 Nocturia: Secondary | ICD-10-CM

## 2024-01-12 DIAGNOSIS — N401 Enlarged prostate with lower urinary tract symptoms: Secondary | ICD-10-CM

## 2024-01-12 DIAGNOSIS — N3281 Overactive bladder: Secondary | ICD-10-CM

## 2024-01-12 DIAGNOSIS — R350 Frequency of micturition: Secondary | ICD-10-CM

## 2024-01-12 LAB — URINALYSIS, ROUTINE W REFLEX MICROSCOPIC
Bilirubin, UA: NEGATIVE
Ketones, UA: NEGATIVE
Leukocytes,UA: NEGATIVE
Nitrite, UA: NEGATIVE
Protein,UA: NEGATIVE
RBC, UA: NEGATIVE
Specific Gravity, UA: 1.02 (ref 1.005–1.030)
Urobilinogen, Ur: 0.2 mg/dL (ref 0.2–1.0)
pH, UA: 6 (ref 5.0–7.5)

## 2024-01-12 MED ORDER — FINASTERIDE 5 MG PO TABS: 5.0000 mg | ORAL_TABLET | Freq: Every day | ORAL | 3 refills | Status: AC

## 2024-01-12 MED ORDER — ALFUZOSIN HCL ER 10 MG PO TB24: 10.0000 mg | ORAL_TABLET | Freq: Every day | ORAL | 3 refills | Status: DC

## 2024-01-12 MED ORDER — SOLIFENACIN SUCCINATE 10 MG PO TABS: 10.0000 mg | ORAL_TABLET | Freq: Every day | ORAL | 11 refills | Status: AC

## 2024-01-12 NOTE — Progress Notes (Signed)
 01/12/2024 8:49 AM   Darryl Mayer 1955-05-09 [id number]  Referring provider: Tobie Guy, DO [address] [place],  [place] [address]  Followup BPH   HPI: Mr Samson is a 68yo here for followup for BPH and OAB. IPSS 7 QOL 3 on uroxatral  10mg  dily and vesicare  5mg  daily. Nocturia 2x. Uirne stream mostly strong. No straining to urinate. No recent PSA. He notes his nocturia has improved on vesicare  but he is still bothered by his nocturia. No urinary urgency or frequency   PMH: Past Medical History:  Diagnosis Date   Diabetes mellitus, type II (HCC)    Hyperlipidemia    Hypertension     Surgical History: Past Surgical History:  Procedure Laterality Date   HEMORROIDECTOMY      Home Medications:  Allergies as of 01/12/2024   No Known Allergies      Medication List        Accurate as of January 12, 2024  8:49 AM. If you have any questions, ask your nurse or doctor.          alfuzosin  10 MG 24 hr tablet Commonly known as: UROXATRAL  Take 1 tablet (10 mg total) by mouth at bedtime.   aspirin EC 81 MG tablet Take by mouth.   finasteride  5 MG tablet Commonly known as: PROSCAR  Take 1 tablet (5 mg total) by mouth daily.   finasteride  5 MG tablet Commonly known as: PROSCAR  Take 1 tablet (5 mg total) by mouth daily.   glipiZIDE  5 MG 24 hr tablet Commonly known as: GLUCOTROL  XL Take 1 tablet (5 mg total) by mouth daily with breakfast.   ketoconazole 2 % shampoo Commonly known as: NIZORAL Apply topically 2 (two) times a week.   latanoprost 0.005 % ophthalmic solution Commonly known as: XALATAN SMARTSIG:1 In Eye(s) Every Evening   lisinopril-hydrochlorothiazide 20-12.5 MG tablet Commonly known as: ZESTORETIC Take 2 tablets by mouth daily.   metFORMIN  500 MG tablet Commonly known as: GLUCOPHAGE  Take 1 tablet (500 mg total) by mouth daily with breakfast.   NIFEdipine 90 MG 24 hr tablet Commonly known as: PROCARDIA XL/NIFEDICAL-XL TAKE 1 TABLET BY MOUTH  EVERY DAY   ONE TOUCH SURESOFT Misc Use to check glucose twice daily   OneTouch Delica Plus Lancet30G Misc USE TO CHECK BLOOD SUGAR TWICE DAILY. E11.65   OneTouch Delica Plus Lancing Misc Use to check glucose daily   OneTouch Verio test strip Generic drug: glucose blood Use to monitor blood glucose twice daily as directed by provider.   OneTouch Verio w/Device Kit Use as instructed to test blood glucose twice daily   potassium chloride 10 MEQ tablet Commonly known as: KLOR-CON Take by mouth.   solifenacin  5 MG tablet Commonly known as: VESICARE  TAKE 1 TABLET (5 MG TOTAL) BY MOUTH DAILY.   tadalafil 5 MG tablet Commonly known as: CIALIS Take 5 mg by mouth daily as needed.        Allergies: No Known Allergies  Family History: Family History  Problem Relation Age of Onset   Hypertension Father    CAD Father    Heart attack Father    Hypertension Sister    Diabetes Sister    Hypertension Brother    Diabetes Brother     Social History:  reports that he has never smoked. He has never used smokeless tobacco. He reports that he does not use drugs. No history on file for alcohol use.  ROS: All other review of systems were reviewed and are negative except  what is noted above in HPI  Physical Exam: BP (!) 147/69   Pulse 64   Constitutional:  Alert and oriented, No acute distress. HEENT: [place] AT, moist mucus membranes.  Trachea midline, no masses. Cardiovascular: No clubbing, cyanosis, or edema. Respiratory: Normal respiratory effort, no increased work of breathing. GI: Abdomen is soft, nontender, nondistended, no abdominal masses GU: No CVA tenderness.  Lymph: No cervical or inguinal lymphadenopathy. Skin: No rashes, bruises or suspicious lesions. Neurologic: Grossly intact, no focal deficits, moving all 4 extremities. Psychiatric: Normal mood and affect.  Laboratory Data: Lab Results  Component Value Date   WBC 7.3 03/24/2020   HGB 12.4 (L) 03/24/2020   HCT  37.6 (L) 03/24/2020   MCV 91.9 03/24/2020   PLT 251 03/24/2020    Lab Results  Component Value Date   CREATININE 1.5 (A) 02/16/2021    No results found for: PSA  No results found for: TESTOSTERONE  Lab Results  Component Value Date   HGBA1C 8.1 (A) 11/15/2023    Urinalysis    Component Value Date/Time   COLORURINE YELLOW 03/24/2020 1126   APPEARANCEUR Clear 06/27/2023 1615   LABSPEC 1.012 03/24/2020 1126   PHURINE 6.0 03/24/2020 1126   GLUCOSEU Negative 06/27/2023 1615   HGBUR NEGATIVE 03/24/2020 1126   BILIRUBINUR Negative 06/27/2023 1615   KETONESUR NEGATIVE 03/24/2020 1126   PROTEINUR Trace (A) 06/27/2023 1615   PROTEINUR NEGATIVE 03/24/2020 1126   NITRITE Negative 06/27/2023 1615   NITRITE NEGATIVE 03/24/2020 1126   LEUKOCYTESUR Negative 06/27/2023 1615   LEUKOCYTESUR NEGATIVE 03/24/2020 1126    Lab Results  Component Value Date   LABMICR Comment 06/27/2023   WBCUA 0-5 05/03/2023   LABEPIT 0-10 05/03/2023   BACTERIA None seen 05/03/2023    Pertinent Imaging:  No results found for this or any previous visit.  No results found for this or any previous visit.  No results found for this or any previous visit.  No results found for this or any previous visit.  No results found for this or any previous visit.  No results found for this or any previous visit.  No results found for this or any previous visit.  No results found for this or any previous visit.   Assessment & Plan:    1. Benign prostatic hyperplasia with urinary frequency (Primary) -continue uroxatral  10mg   - Urinalysis, Routine w reflex microscopic  2. OAB (overactive bladder) Increase vesicare  to 10mg   3. Nocturia -increase vesicare  10mg    PSA today, will call with results   No follow-ups on file.  Belvie Clara, MD  [facility] [place]

## 2024-01-12 NOTE — Patient Instructions (Signed)

## 2024-01-13 LAB — PSA: Prostate Specific Ag, Serum: 1.3 ng/mL (ref 0.0–4.0)

## 2024-01-22 ENCOUNTER — Other Ambulatory Visit: Payer: Self-pay | Admitting: Nurse Practitioner

## 2024-01-30 ENCOUNTER — Other Ambulatory Visit: Payer: Self-pay | Admitting: *Deleted

## 2024-01-30 DIAGNOSIS — Z7984 Long term (current) use of oral hypoglycemic drugs: Secondary | ICD-10-CM

## 2024-01-30 DIAGNOSIS — N1831 Chronic kidney disease, stage 3a: Secondary | ICD-10-CM

## 2024-01-30 MED ORDER — GLIPIZIDE ER 5 MG PO TB24: 5.0000 mg | ORAL_TABLET | Freq: Every day | ORAL | 3 refills | Status: AC

## 2024-02-07 ENCOUNTER — Telehealth: Payer: Self-pay

## 2024-02-07 NOTE — Telephone Encounter (Signed)
 Patient called to get PSA results, patient made aware that the PSA numbers were a 1.3 patient made aware MD would have to confirm or deny if the lab its self is normal patient voiced his understanding

## 2024-02-12 ENCOUNTER — Ambulatory Visit: Payer: Self-pay | Admitting: Urology

## 2024-02-13 NOTE — Telephone Encounter (Signed)
"  Letter sent  "

## 2024-02-13 NOTE — Telephone Encounter (Signed)
 Called patient to give him PSA results per [facility], patient voiced his understanding

## 2024-02-23 ENCOUNTER — Other Ambulatory Visit: Payer: Self-pay | Admitting: Urology

## 2024-02-23 DIAGNOSIS — N401 Enlarged prostate with lower urinary tract symptoms: Secondary | ICD-10-CM

## 2024-02-23 DIAGNOSIS — R351 Nocturia: Secondary | ICD-10-CM

## 2024-03-19 ENCOUNTER — Ambulatory Visit: Admitting: Nurse Practitioner

## 2024-07-17 ENCOUNTER — Ambulatory Visit: Admitting: Urology
